# Patient Record
Sex: Female | Born: 1960 | Race: Black or African American | Hispanic: No | Smoking: Former smoker
Health system: Southern US, Community
[De-identification: ages and names within clinical notes are randomized; demographics above are authoritative.]

## PROBLEM LIST (undated history)

## (undated) DIAGNOSIS — I2699 Other pulmonary embolism without acute cor pulmonale: Secondary | ICD-10-CM

## (undated) DIAGNOSIS — T4145XA Adverse effect of unspecified anesthetic, initial encounter: Secondary | ICD-10-CM

## (undated) DIAGNOSIS — Z8719 Personal history of other diseases of the digestive system: Secondary | ICD-10-CM

## (undated) DIAGNOSIS — C3490 Malignant neoplasm of unspecified part of unspecified bronchus or lung: Secondary | ICD-10-CM

## (undated) DIAGNOSIS — I2510 Atherosclerotic heart disease of native coronary artery without angina pectoris: Secondary | ICD-10-CM

## (undated) DIAGNOSIS — J449 Chronic obstructive pulmonary disease, unspecified: Secondary | ICD-10-CM

## (undated) DIAGNOSIS — C801 Malignant (primary) neoplasm, unspecified: Secondary | ICD-10-CM

## (undated) DIAGNOSIS — I739 Peripheral vascular disease, unspecified: Secondary | ICD-10-CM

## (undated) DIAGNOSIS — C3491 Malignant neoplasm of unspecified part of right bronchus or lung: Secondary | ICD-10-CM

## (undated) DIAGNOSIS — I639 Cerebral infarction, unspecified: Secondary | ICD-10-CM

## (undated) DIAGNOSIS — F419 Anxiety disorder, unspecified: Secondary | ICD-10-CM

## (undated) DIAGNOSIS — I10 Essential (primary) hypertension: Secondary | ICD-10-CM

## (undated) DIAGNOSIS — H8110 Benign paroxysmal vertigo, unspecified ear: Secondary | ICD-10-CM

## (undated) HISTORY — DX: Malignant neoplasm of unspecified part of unspecified bronchus or lung: C34.90

## (undated) HISTORY — PX: BACK SURGERY: SHX140

## (undated) HISTORY — DX: Malignant neoplasm of unspecified part of right bronchus or lung: C34.91

## (undated) HISTORY — PX: UTERINE FIBROID SURGERY: SHX826

## (undated) HISTORY — PX: CERVICAL SPINE SURGERY: SHX589

---

## 1987-07-01 HISTORY — PX: CYST EXCISION: SHX5701

## 2003-07-01 HISTORY — PX: UTERINE FIBROID EMBOLIZATION: SHX825

## 2009-03-04 ENCOUNTER — Emergency Department (HOSPITAL_COMMUNITY): Admission: EM | Admit: 2009-03-04 | Discharge: 2009-03-04 | Payer: Self-pay | Admitting: Emergency Medicine

## 2010-10-04 LAB — BASIC METABOLIC PANEL
BUN: 12 mg/dL (ref 6–23)
GFR calc Af Amer: >60 mL/min (ref >60)
GFR calc non Af Amer: >60 mL/min (ref >60)
Potassium: 4 mEq/L (ref 3.5–5.1)
Sodium: 138 mEq/L (ref 135–145)

## 2010-10-04 LAB — CBC
HCT: 42.4 % (ref 36.0–46.0)
Hemoglobin: 14.2 g/dL (ref 12.0–15.0)
MCV: 90.5 fL (ref 78.0–100.0)
Platelets: 286 10*3/uL (ref 150–400)
RBC: 4.69 MIL/uL (ref 3.87–5.11)
WBC: 9.9 10*3/uL (ref 4.0–10.5)

## 2010-10-04 LAB — DIFFERENTIAL
Eosinophils Absolute: 0.1 10*3/uL (ref 0.0–0.7)
Eosinophils Relative: 1 % (ref 0–5)
Lymphocytes Relative: 37 % (ref 12–46)
Lymphs Abs: 3.6 10*3/uL (ref 0.7–4.0)
Monocytes Absolute: 0.7 10*3/uL (ref 0.1–1.0)
Monocytes Relative: 7 % (ref 3–12)

## 2010-10-04 LAB — BRAIN NATRIURETIC PEPTIDE: Pro B Natriuretic peptide (BNP): <30 pg/mL (ref 0.0–100.0)

## 2011-12-29 HISTORY — PX: CORONARY ANGIOPLASTY: SHX604

## 2012-06-30 DIAGNOSIS — I639 Cerebral infarction, unspecified: Secondary | ICD-10-CM

## 2012-06-30 HISTORY — DX: Cerebral infarction, unspecified: I63.9

## 2013-01-28 DIAGNOSIS — C801 Malignant (primary) neoplasm, unspecified: Secondary | ICD-10-CM

## 2013-01-28 HISTORY — DX: Malignant (primary) neoplasm, unspecified: C80.1

## 2013-02-10 DIAGNOSIS — T8859XA Other complications of anesthesia, initial encounter: Secondary | ICD-10-CM

## 2013-02-10 HISTORY — DX: Other complications of anesthesia, initial encounter: T88.59XA

## 2013-02-10 HISTORY — PX: TUMOR REMOVAL: SHX12

## 2013-04-08 NOTE — H&P (Signed)
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 Discussed  Multiple stones in the left submandibular duct. I believe these are amenable to intraoral excision. She is very reluctant to have her gland removed. I have explained to her that if the stones do come back, then we we may want to discuss removal of the gland. If they do not come back, and no further treatment would be necessary in the future. We will check with her cardiologist about stopping her Plavix prior to the surgery.   An incidental finding on the CT is right maxillary sinus disease, with calcifications, consistent with fungal sinusitis. She may require surgery for debridement of that but that can wait until we resolve this other more urgent issue. Reason For Visit  Holly Knight is here today at the kind request of none for consultation and opinion for salivary stone and nasal polyp. HPI  History of recurring left sialadenitis secondary to sialolithiasis which has been documented on CT imaging earlier in the year. In the interim, she was diagnosed with a small lung cancer and had surgery to remove that. She is here for second opinion on what to do about the submandibular gland. She saw an ENT doctor in [place]. Allergies  Penicillins Sulfa Drugs. Current Meds  Baclofen TABS;; RPT Diazepam TABS;; RPT Folic Acid TABS;; RPT HydrALAZINE HCl TABS;; RPT Aspirin TABS;; RPT Lotrel CAPS (Amlodipine Besy-Benazepril HCl);; RPT Lipitor TABS (Atorvastatin Calcium);; RPT Iron TABS;; RPT Plavix TABS (Clopidogrel Bisulfate);; RPT Potassium TABS;; RPT TraMADol HCl TABS;; RPT. Active Problems  Allergic rhinitis   (477.9) (J30.9) Blood clotting disorder   (286.9) (D68.9) Emphysema of lung   (492.8) (J43.9) Heartburn   (787.1) (R12) Hypertension   (401.9) (I10) Migraine headache   (346.90) (G43.909). PMH  BPPV (benign paroxysmal positional vertigo) (386.11) (H81.10) History  of malignant neoplasm of bronchus and lung (V10.11) (Z85.118) History of stroke (V12.54) (Z86.73) History of vascular surgery (V45.89) (Z98.89). PSH  Amputation Of Leg Above Knee Cath Stent Placement Oral Surgery Tooth Extraction. Family Hx  Family history of cardiac disorder: Grandmother (V17.49) (Z82.49) Family history of hypertension: Mother (V2.49) (Z64.49) Family history of lung cancer: Mother (V16.1) (Z80.1) Family history of seizures: Sister (V19.8) (Z84.89). Personal Hx  Alcohol use; social Former smoker 14Aug2014 [phone]) [phone]) No caffeine use. ROS  Systemic: Not feeling tired (fatigue).  No fever  and no night sweats.  Recent weight loss. Head: Headache. Eyes: Eye symptoms. Otolaryngeal: No hearing loss.  Earache.  No tinnitus  and no purulent nasal discharge.  No nasal passage blockage (stuffiness).  Snoring  and sneezing.  No hoarseness  and no sore throat. Cardiovascular: No chest pain or discomfort  and no palpitations. Pulmonary: No dyspnea, no cough, and no wheezing. Gastrointestinal: No dysphagia  and no heartburn.  No nausea, no abdominal pain, and no melena.  No diarrhea. Genitourinary: No dysuria. Endocrine: No muscle weakness. Musculoskeletal: No calf muscle cramps, no arthralgias, and no soft tissue swelling. Neurological: Dizziness.  No fainting.  Tingling.  No numbness. Psychological: Anxiety.  No depression. Skin: No rash. 12 system ROS was obtained and reviewed on the Health Maintenance form dated today.  Positive responses are shown above.  If the symptom is not checked, the patient has denied it. Vital Signs   Recorded by Skolimowski,Sharon on 28 Mar 2013 03:28 PM BP:110/64,  Height: 67 in, Weight: 137 lb, BMI: 21.5 kg/m2,  BMI Calculated: 21.46 ,  BSA Calculated: 1.72. Physical Exam  APPEARANCE: Well developed,  well nourished, in no acute distress.  Normal affect, in a pleasant mood.  Oriented to time, place and person. COMMUNICATION: Normal  voice   HEAD & FACE:  No scars, lesions or masses of head and face.  Sinuses nontender to palpation.  Salivary glands without mass or tenderness.  Facial strength symmetric.  No facial lesion, scars, or mass. EYES: EOMI with normal primary gaze alignment. Visual acuity grossly intact.  PERRLA EXTERNAL EAR & NOSE: No scars, lesions or masses  EAC & TYMPANIC MEMBRANE:  EAC shows no obstructing lesions or debris and tympanic membranes are normal bilaterally with good movement to insufflation. GROSS HEARING: Normal   TMJ:  Nontender  INTRANASAL EXAM: No polyps or purulence.  NASOPHARYNX: Normal, without lesions. LIPS, TEETH & GUMS: No lip lesions, normal dentition and normal gums. ORAL CAVITY/OROPHARYNX:  Oral mucosa moist without lesion or asymmetry of the palate, tongue, tonsil or posterior pharynx. There are 2 hard palpable objects deep to the floor of mouth mucosa. Both on the left side. Clear saliva is easily expressed from the right submandibular duct but not the left. NECK:  Supple without adenopathy or mass, except for the left submandibular gland which is firm and slightly enlarged, minimally tender. THYROID:  Normal with no masses palpable.  NEUROLOGIC:  No gross CN deficits. No nystagmus noted.   LYMPHATIC:  No enlarged nodes palpable. Results  CT reveals 3 radiopaque foreign objects in the left submandibular duct. There is no evidence of calculi within the gland or the hilum. Signature  Electronically signed by : Serena Colonel  M.D.; 03/28/2013 4:58 PM EST.

## 2013-04-08 NOTE — Progress Notes (Signed)
Unable to reach patient.  I reviewed Dr Lucky Rathke office notes from today.  I left a message on voice mail:  arrival time 8:30, main entrance , nothing to eat or drink after midnight, only medications to take day of surgery would be valium and tramadol if needed, take medications with a sip of water only, no lotions, powders, cologne, jewelry, piercings, do not be valuables and phone number for [facility].

## 2013-04-10 MED ORDER — CEFAZOLIN SODIUM-DEXTROSE 2-3 GM-% IV SOLR: 2.0000 g | INTRAVENOUS | Status: AC

## 2013-04-12 ENCOUNTER — Encounter (HOSPITAL_COMMUNITY): Payer: Self-pay | Admitting: *Deleted

## 2013-04-12 NOTE — Progress Notes (Addendum)
Pt has a history of BPPV, CAD with stent in 2012, COPD, HTN . Pt stated that last dose of Plavix was 04/06/13. Pt stated that she had a 'lung cancer surgery in Aug 2014, at [facility]."  Stated that she had cardiac clearance at [facility].  Pt reported that she had a stroke with lung surgery and residual effect is left hand weakness.  I reported history to Dr Noreene Larsson , who requested that records from hospital an d cardiologist be obtained and EKG ,CBC and BMET be done in [facility].  I asked patient to bring a current list of medications with dose and frequency.

## 2013-04-13 ENCOUNTER — Ambulatory Visit (HOSPITAL_COMMUNITY): Payer: Federal, State, Local not specified - PPO

## 2013-04-13 ENCOUNTER — Encounter (HOSPITAL_COMMUNITY)
Admission: RE | Disposition: A | Discharge status: Home / Self Care | Payer: Self-pay | Source: Ambulatory Visit | Attending: Otolaryngology

## 2013-04-13 ENCOUNTER — Encounter (HOSPITAL_COMMUNITY): Payer: Self-pay | Admitting: *Deleted

## 2013-04-13 ENCOUNTER — Ambulatory Visit (HOSPITAL_COMMUNITY)
Admission: RE | Admit: 2013-04-13 | Discharge: 2013-04-13 | Disposition: A | Discharge status: Home / Self Care | Payer: Federal, State, Local not specified - PPO | Source: Ambulatory Visit | Attending: Otolaryngology | Admitting: Otolaryngology

## 2013-04-13 ENCOUNTER — Encounter (HOSPITAL_COMMUNITY): Payer: Federal, State, Local not specified - PPO | Admitting: Anesthesiology

## 2013-04-13 ENCOUNTER — Ambulatory Visit (HOSPITAL_COMMUNITY): Payer: Federal, State, Local not specified - PPO | Admitting: Anesthesiology

## 2013-04-13 DIAGNOSIS — Z7902 Long term (current) use of antithrombotics/antiplatelets: Secondary | ICD-10-CM | POA: Insufficient documentation

## 2013-04-13 DIAGNOSIS — J449 Chronic obstructive pulmonary disease, unspecified: Secondary | ICD-10-CM | POA: Insufficient documentation

## 2013-04-13 DIAGNOSIS — H8110 Benign paroxysmal vertigo, unspecified ear: Secondary | ICD-10-CM | POA: Insufficient documentation

## 2013-04-13 DIAGNOSIS — Z8673 Personal history of transient ischemic attack (TIA), and cerebral infarction without residual deficits: Secondary | ICD-10-CM | POA: Insufficient documentation

## 2013-04-13 DIAGNOSIS — K115 Sialolithiasis: Secondary | ICD-10-CM | POA: Insufficient documentation

## 2013-04-13 DIAGNOSIS — K449 Diaphragmatic hernia without obstruction or gangrene: Secondary | ICD-10-CM | POA: Insufficient documentation

## 2013-04-13 DIAGNOSIS — Z85118 Personal history of other malignant neoplasm of bronchus and lung: Secondary | ICD-10-CM | POA: Insufficient documentation

## 2013-04-13 DIAGNOSIS — J4489 Other specified chronic obstructive pulmonary disease: Secondary | ICD-10-CM | POA: Insufficient documentation

## 2013-04-13 DIAGNOSIS — I10 Essential (primary) hypertension: Secondary | ICD-10-CM | POA: Insufficient documentation

## 2013-04-13 DIAGNOSIS — Z01812 Encounter for preprocedural laboratory examination: Secondary | ICD-10-CM | POA: Insufficient documentation

## 2013-04-13 DIAGNOSIS — F411 Generalized anxiety disorder: Secondary | ICD-10-CM | POA: Insufficient documentation

## 2013-04-13 DIAGNOSIS — J320 Chronic maxillary sinusitis: Secondary | ICD-10-CM | POA: Insufficient documentation

## 2013-04-13 DIAGNOSIS — Z01810 Encounter for preprocedural cardiovascular examination: Secondary | ICD-10-CM | POA: Insufficient documentation

## 2013-04-13 DIAGNOSIS — D689 Coagulation defect, unspecified: Secondary | ICD-10-CM | POA: Insufficient documentation

## 2013-04-13 DIAGNOSIS — I739 Peripheral vascular disease, unspecified: Secondary | ICD-10-CM | POA: Insufficient documentation

## 2013-04-13 DIAGNOSIS — J309 Allergic rhinitis, unspecified: Secondary | ICD-10-CM | POA: Insufficient documentation

## 2013-04-13 DIAGNOSIS — J438 Other emphysema: Secondary | ICD-10-CM | POA: Insufficient documentation

## 2013-04-13 DIAGNOSIS — Z01818 Encounter for other preprocedural examination: Secondary | ICD-10-CM | POA: Insufficient documentation

## 2013-04-13 DIAGNOSIS — I2510 Atherosclerotic heart disease of native coronary artery without angina pectoris: Secondary | ICD-10-CM | POA: Insufficient documentation

## 2013-04-13 DIAGNOSIS — G43909 Migraine, unspecified, not intractable, without status migrainosus: Secondary | ICD-10-CM | POA: Insufficient documentation

## 2013-04-13 HISTORY — DX: Anxiety disorder, unspecified: F41.9

## 2013-04-13 HISTORY — DX: Essential (primary) hypertension: I10

## 2013-04-13 HISTORY — DX: Atherosclerotic heart disease of native coronary artery without angina pectoris: I25.10

## 2013-04-13 HISTORY — DX: Benign paroxysmal vertigo, unspecified ear: H81.10

## 2013-04-13 HISTORY — DX: Personal history of other diseases of the digestive system: Z87.19

## 2013-04-13 HISTORY — DX: Malignant (primary) neoplasm, unspecified: C80.1

## 2013-04-13 HISTORY — PX: SALIVARY STONE REMOVAL: SHX5213

## 2013-04-13 HISTORY — DX: Chronic obstructive pulmonary disease, unspecified: J44.9

## 2013-04-13 HISTORY — DX: Cerebral infarction, unspecified: I63.9

## 2013-04-13 HISTORY — DX: Adverse effect of unspecified anesthetic, initial encounter: T41.45XA

## 2013-04-13 HISTORY — DX: Peripheral vascular disease, unspecified: I73.9

## 2013-04-13 HISTORY — DX: Other pulmonary embolism without acute cor pulmonale: I26.99

## 2013-04-13 LAB — CBC
MCV: 83.7 fL (ref 78.0–100.0)
Platelets: 291 10*3/uL (ref 150–400)
RBC: 3.99 MIL/uL (ref 3.87–5.11)
WBC: 5.3 10*3/uL (ref 4.0–10.5)

## 2013-04-13 LAB — BASIC METABOLIC PANEL
CO2: 20 mEq/L (ref 19–32)
Calcium: 10.8 mg/dL — ABNORMAL HIGH (ref 8.4–10.5)
Chloride: 102 mEq/L (ref 96–112)
Sodium: 137 mEq/L (ref 135–145)

## 2013-04-13 SURGERY — REMOVAL, CALCULUS, SALIVARY DUCT
Anesthesia: Monitor Anesthesia Care | Wound class: Clean Contaminated

## 2013-04-13 MED ORDER — HYDROMORPHONE HCL PF 1 MG/ML IJ SOLN: 0.2500 mg | INTRAMUSCULAR | Status: DC | PRN

## 2013-04-13 MED ORDER — FENTANYL CITRATE 0.05 MG/ML IJ SOLN
INTRAMUSCULAR | Status: DC | PRN
  Administered 2013-04-13: 100 ug via INTRAVENOUS

## 2013-04-13 MED ORDER — MIDAZOLAM HCL 2 MG/2ML IJ SOLN: 1.0000 mg | INTRAMUSCULAR | Status: DC | PRN

## 2013-04-13 MED ORDER — OXYCODONE HCL 5 MG PO TABS: 5.0000 mg | ORAL_TABLET | Freq: Once | ORAL | Status: DC | PRN

## 2013-04-13 MED ORDER — LACTATED RINGERS IV SOLN
INTRAVENOUS | Status: DC | PRN
  Administered 2013-04-13: 12:00:00 via INTRAVENOUS

## 2013-04-13 MED ORDER — PROMETHAZINE HCL 25 MG RE SUPP: 25.0000 mg | Freq: Four times a day (QID) | RECTAL | Status: DC | PRN

## 2013-04-13 MED ORDER — OXYCODONE HCL 5 MG/5ML PO SOLN: 5.0000 mg | Freq: Once | ORAL | Status: DC | PRN

## 2013-04-13 MED ORDER — MIDAZOLAM HCL 5 MG/5ML IJ SOLN
INTRAMUSCULAR | Status: DC | PRN
  Administered 2013-04-13 (×2): 2 mg via INTRAVENOUS

## 2013-04-13 MED ORDER — LACTATED RINGERS IV SOLN
INTRAVENOUS | Status: DC
  Administered 2013-04-13: 11:00:00 via INTRAVENOUS

## 2013-04-13 MED ORDER — LIDOCAINE-EPINEPHRINE 1 %-1:100000 IJ SOLN
INTRAMUSCULAR | Status: AC
  Filled 2013-04-13: qty 1

## 2013-04-13 MED ORDER — PROMETHAZINE HCL 25 MG/ML IJ SOLN: 6.2500 mg | INTRAMUSCULAR | Status: DC | PRN

## 2013-04-13 MED ORDER — CLINDAMYCIN HCL 300 MG PO CAPS: 300.0000 mg | ORAL_CAPSULE | Freq: Two times a day (BID) | ORAL | Status: DC

## 2013-04-13 MED ORDER — HYDROCODONE-ACETAMINOPHEN 7.5-325 MG PO TABS: 1.0000 | ORAL_TABLET | Freq: Four times a day (QID) | ORAL | Status: DC | PRN

## 2013-04-13 MED ORDER — LIDOCAINE-EPINEPHRINE 1 %-1:100000 IJ SOLN
INTRAMUSCULAR | Status: DC | PRN
  Administered 2013-04-13: 20 mL

## 2013-04-13 MED ORDER — FENTANYL CITRATE 0.05 MG/ML IJ SOLN: 50.0000 ug | Freq: Once | INTRAMUSCULAR | Status: DC

## 2013-04-13 MED ORDER — 0.9 % SODIUM CHLORIDE (POUR BTL) OPTIME
TOPICAL | Status: DC | PRN
  Administered 2013-04-13: 1000 mL

## 2013-04-13 SURGICAL SUPPLY — 21 items
CANISTER SUCTION 2500CC (MISCELLANEOUS) ×2 IMPLANT
CLOTH BEACON ORANGE TIMEOUT ST (SAFETY) ×2 IMPLANT
CONT SPEC 4OZ CLIKSEAL STRL BL (MISCELLANEOUS) ×2 IMPLANT
COVER SURGICAL LIGHT HANDLE (MISCELLANEOUS) ×2 IMPLANT
ELECT COATED BLADE 2.86 ST (ELECTRODE) ×2 IMPLANT
ELECT REM PT RETURN 9FT ADLT (ELECTROSURGICAL) ×2
ELECTRODE REM PT RTRN 9FT ADLT (ELECTROSURGICAL) ×1 IMPLANT
GAUZE SPONGE 4X4 16PLY XRAY LF (GAUZE/BANDAGES/DRESSINGS) ×2 IMPLANT
GLOVE BIO SURGEON STRL SZ 6.5 (GLOVE) ×2 IMPLANT
GLOVE BIOGEL PI IND STRL 6.5 (GLOVE) ×1 IMPLANT
GLOVE BIOGEL PI INDICATOR 6.5 (GLOVE) ×1
GLOVE ECLIPSE 7.5 STRL STRAW (GLOVE) ×2 IMPLANT
GOWN STRL NON-REIN LRG LVL3 (GOWN DISPOSABLE) ×4 IMPLANT
KIT BASIN OR (CUSTOM PROCEDURE TRAY) ×2 IMPLANT
KIT ROOM TURNOVER OR (KITS) ×2 IMPLANT
NEEDLE 27GAX1X1/2 (NEEDLE) ×2 IMPLANT
NS IRRIG 1000ML POUR BTL (IV SOLUTION) ×2 IMPLANT
PAD ARMBOARD 7.5X6 YLW CONV (MISCELLANEOUS) ×4 IMPLANT
PENCIL FOOT CONTROL (ELECTRODE) ×2 IMPLANT
TOWEL OR 17X26 10 PK STRL BLUE (TOWEL DISPOSABLE) ×2 IMPLANT
TRAY ENT MC OR (CUSTOM PROCEDURE TRAY) ×2 IMPLANT

## 2013-04-13 NOTE — Anesthesia Preprocedure Evaluation (Addendum)
Anesthesia Evaluation  Patient identified by MRN, date of birth, ID band Patient awake    Reviewed: Allergy & Precautions, H&P , NPO status , Patient's Chart, lab work & pertinent test results  Airway Mallampati: II TM Distance: >3 FB Neck ROM: Full    Dental   Pulmonary COPD + rhonchi   + decreased breath sounds      Cardiovascular hypertension, + CAD and + Peripheral Vascular Disease Rhythm:Regular Rate:Normal     Neuro/Psych Anxiety CVA    GI/Hepatic hiatal hernia,   Endo/Other    Renal/GU      Musculoskeletal   Abdominal   Peds  Hematology   Anesthesia Other Findings   Reproductive/Obstetrics                          Anesthesia Physical Anesthesia Plan  ASA: III  Anesthesia Plan: MAC   Post-op Pain Management:    Induction: Intravenous  Airway Management Planned: Nasal Cannula  Additional Equipment:   Intra-op Plan:   Post-operative Plan: Extubation in OR  Informed Consent: I have reviewed the patients History and Physical, chart, labs and discussed the procedure including the risks, benefits and alternatives for the proposed anesthesia with the patient or authorized representative who has indicated his/her understanding and acceptance.     Plan Discussed with: CRNA and Surgeon  Anesthesia Plan Comments: (Pt desires MAC-surgeon in agreement)       Anesthesia Quick Evaluation

## 2013-04-13 NOTE — Transfer of Care (Signed)
Immediate Anesthesia Transfer of Care Note  Patient: Holly Knight  Procedure(s) Performed: Procedure(s): INTRAORAL EXCISION SUBMANDIBULAR STONES  (N/A)  Patient Location: PACU  Anesthesia Type:MAC  Level of Consciousness: awake, alert  and oriented  Airway & Oxygen Therapy: Patient Spontanous Breathing  Post-op Assessment: Report given to PACU RN and Post -op Vital signs reviewed and stable  Post vital signs: Reviewed and stable  Complications: No apparent anesthesia complications

## 2013-04-13 NOTE — Anesthesia Postprocedure Evaluation (Signed)
  Anesthesia Post-op Note  Patient: Holly Knight  Procedure(s) Performed: Procedure(s): INTRAORAL EXCISION SUBMANDIBULAR STONES  (N/A)  Patient Location: PACU  Anesthesia Type:MAC  Level of Consciousness: awake and alert   Airway and Oxygen Therapy: Patient Spontanous Breathing  Post-op Pain: mild  Post-op Assessment: Post-op Vital signs reviewed, Patient's Cardiovascular Status Stable, Respiratory Function Stable, Patent Airway, No signs of Nausea or vomiting and Pain level controlled  Post-op Vital Signs: Reviewed and stable  Complications: No apparent anesthesia complications

## 2013-04-13 NOTE — Op Note (Signed)
OPERATIVE REPORT  DATE OF SURGERY: 04/13/2013  PATIENT:  Holly Knight,  52 y.o. female  PRE-OPERATIVE DIAGNOSIS:  SUBMANDIBULAR STONES   POST-OPERATIVE DIAGNOSIS:  SUBMANDIBULAR STONES   PROCEDURE:  Procedure(s): INTRAORAL EXCISION SUBMANDIBULAR STONES   SURGEON:  Susy Frizzle, MD  ASSISTANTS: none  ANESTHESIA:   Local with intravenous sedation and  monitored anesthesia care  EBL:  5 ml  DRAINS: None   LOCAL MEDICATIONS USED:  1% Xylocaine with epinephrine  SPECIMEN:  3 submandibular calculi  COUNTS:  Correct  PROCEDURE DETAILS: The patient was taken to the operating room and placed on the operating table in the supine position. Following induction of intravenous sedation the patient was draped in a standard fashion. 1% Xylocaine with epinephrine was infiltrated into the floor of mouth on the left side. The submandibular duct was identified and cannulated with a small lacrimal probe. It was then serially dilated up to 1.8 mm. 15 scalpel was used to incise the overlying mucosa opening up the duct widely. Purulent material was obtained from the duct and were suctioned away. 2 small stones were identified. Additional dissection further back reveals the major larger stone. Following that there was no further purulent secretions and no further stone palpable. The patient tolerated this well and was transferred to PACU in stable condition.    PATIENT DISPOSITION:  To PACU, stable

## 2013-04-13 NOTE — Interval H&P Note (Signed)
History and Physical Interval Note:  04/13/2013 12:19 PM  Holly Knight  has presented today for surgery, with the diagnosis of SUBMANDIBULAR STONES   The various methods of treatment have been discussed with the patient and family. After consideration of risks, benefits and other options for treatment, the patient has consented to  Procedure(s): INTRAORAL EXCISION SUBMANDIBULAR STONES  (N/A) as a surgical intervention .  The patient's history has been reviewed, patient examined, no change in status, stable for surgery.  I have reviewed the patient's chart and labs.  Questions were answered to the patient's satisfaction.     Sakiyah Shur

## 2013-04-13 NOTE — Preoperative (Signed)
Beta Blockers   Reason not to administer Beta Blockers:Not Applicable 

## 2013-04-15 ENCOUNTER — Encounter (HOSPITAL_COMMUNITY): Payer: Self-pay | Admitting: Otolaryngology

## 2016-08-05 ENCOUNTER — Telehealth (HOSPITAL_COMMUNITY): Payer: Self-pay | Admitting: Oncology

## 2016-08-05 NOTE — Patient Instructions (Addendum)
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  We are going to treat you with cisplatin and etoposide.  Cisplatin is day 1 every 21 days.  Etoposide is day 1, day 2, and day 3 every 21 days.  You will get 4 cycles of this treatment.  1 cycle is 21 days.  With the cisplatin treatment you will get 2 hours of hydration prior to receiving that drug and 2 hours of hydration fluid after.  This will help protect your kidneys.  This treatment is with curative intent.   You will see the doctor regularly throughout treatment.  We monitor your lab work prior to every treatment.  The doctor monitors your response to treatment by the way you are feeling, your blood work, and scans periodically.  You will receive the following premedications prior to getting chemotherapy: Premeds: Aloxi - high powered nausea/vomiting prevention medication used for chemotherapy patients. Emend - high powered nausea/vomiting prevention medication used for chemotherapy patients. Dexamethasone - steroid - given to reduce the risk of you having an allergic type reaction to the chemotherapy. Dex can cause you to feel energized, nervous/anxious/jittery, make you have trouble sleeping, and/or make you feel hot/flushed in the face/neck and/or look pink/red in the face/neck. These side effects will pass as the Dex wears off. (takes 20 minutes to infuse)   POTENTIAL SIDE EFFECTS OF TREATMENT: Cisplatin (Generic Name) Other Names: Platinol, platinum  About This Drug Cisplatin is a drug used to treat cancer. This drug is given in the vein (IV). Takes 1 hour to infuse.  Possible Side Effects (More Common) . This drug may affect how your kidneys work. Your kidney function will be checked as needed. . Electrolyte changes. Your blood will be checked for electrolyte changes as needed. . High-frequency hearing loss may occur. You will get IV fluids before and  during the Cisplatin infusion to help prevent this. You may also get ringing in the ears. . Bone marrow depression. This is a decrease in the number of white blood cells, red blood cells, and platelets. This may raise your risk of infection, make you tired and weak (fatigue), and raise your risk of bleeding. . Nausea and throwing up (vomiting). These symptoms may happen within a few hours after your treatment and may last for a few days to a week. Medicines are available to stop or lessen these side effects.  Possible Side Effects (Less Common) . Effects on the nerves are called peripheral neuropathy. You may feel numbness or pain in your hands and feet. It may be hard for you to button your clothes, open jars, or walk as usual. The effect on the nerves may get worse with more doses of the drug. These effects get better in some people after the drug is stopped, but it does not get better in all people. Marland Kitchen Blurred vision or other changes in eyesight. . Soreness of the mouth and throat. You may have red areas, white patches, or sores that hurt. . Hair loss. You may notice your hair getting thin. Some patients lose their hair. Your hair often grows back when treatment is done.  Allergic Reactions Allergic reactions to this drug are rare, but may happen in some patients. Signs of allergic reactions to this drug may be a rash, fever, chills, feeling dizzy, trouble breathing, and/or feeling that your heart is beating in a fast or not normal way.  Treating Side Effects .  Drink 6-8 cups of fluids each day unless your doctor has told you to limit your fluid intake due to some other health problem. A cup is 8 ounces of fluid. If you throw up or have loose bowel movements you should drink more fluids so that you do not become dehydrated (lack water in the body due to losing too much fluid). . If you have numbness and tingling in your hands and feet, be careful when cooking, walking, and handling sharp objects and  hot liquids. . Mouth care is very important. Your mouth care should consist of routine, gentle cleaning of your teeth or dentures and rinsing your mouth with a mixture of 1/2 teaspoon of salt in 8 ounces of water or  teaspoon of baking soda in 8 ounces of water. This should be done at least after each meal and at bedtime. . If you have mouth sores, avoid mouthwash that has alcohol. Also avoid alcohol and smoking because they can bother your mouth and throat. . Talk with your nurse about getting a wig before you lose your hair. Also, call the [place] at 800-ACS-2345 to find out information about the "Look Good, Feel Better" program close to where you live. It is a free program where women getting chemotherapy can learn about wigs, turbans and scarves as well as makeup techniques and skin and nail care.  Food and Drug Interactions There are no known interactions of Cisplatin with food. This drug may interact with other medicines. Tell your doctor and pharmacist about all the medicines and dietary supplements (vitamins, minerals, herbs and others) that you are taking at this time. The safety and use of dietary supplements and alternative diets are often not known. Using these might affect your cancer or interfere with your treatment. Until more is known, you should not use dietary supplements or alternative diets without your cancer doctor's help.  When to Call the Doctor Call your doctor or nurseright awayif you have any of these symptoms: . Rash or itching . Feeling dizzy or lightheaded . Wheezing or trouble breathing . Swelling of the face . Fever of 100.5 F (38 C) or above . Chills . Easy bleeding or bruising . Decreased urine . Weight gain of 5 pounds in one week (fluid retention) . Nausea that stops you from eating or drinking . Throwing up more than 3 times a day Call your doctor or nurse as soon as possible if you have these symptoms: . Numbness, tingling, decreased  feeling or weakness in fingers, toes, arms, or legs . Trouble walking or changes in the way you walk, feeling clumsy when buttoning clothes, opening jars, or other routine hand motions . Blurred vision or other changes in eyesight . Changes in hearing, ringing in the ears . Pain in your mouth or throat that makes it hard to eat or drink . Fatigue that interferes with your daily activities  Sexual Problems and Reproductive Concerns . Infertility warning: Sexual problems and reproduction concerns may occur. In both men and women, this drug may affect your ability to have children. This cannot be determined before your treatment. Speak with your doctor or nurse if you plan to have children. Ask for information on sperm or egg banking. . In men, this drug may interfere with your ability to make sperm, but it should not change your ability to have sexual relations. . In women, menstrual bleeding may become irregular or stop while you are receiving this drug. Do not assume that you  cannot become pregnant if you do not have a menstrual period. . Women may experience signs of menopause like vaginal dryness, itching, and pain during sexual relations . Genetic counseling is available for you to talk about the effects of this drug therapy on future pregnancies. Also, a genetic counselor can look at the possible risk of problems in the unborn baby due to this medicine if an exposure happens during pregnancy. . Pregnancy warning: The drug may have harmful effects on the unborn child, so effective methods of birth control should be used during your cancer treatment. . Breast feeding warning: Women should not breast feed during treatment because this drug could enter the breast milk and badly harm a breast feeding baby.   Etoposide (Generic Name) Other Names: VePesid, VP-16  About This Drug Etoposide is a drug used to treat cancer. This drug is given in the vein (IV) or by mouth.  Takes 1 hour to infuse.     Possible Side Effects (More Common) . Nausea and throwing up (vomiting). These symptoms may happen within one to six hours after getting this drug and may last up to 72 hours. Medicines are available to stop or lessen these side effects . Bone marrow depression. This is a decrease in the number of white blood cells, red blood cells, and platelets. This may raise your risk of infection, make you tired and weak (fatigue), and raise your risk of bleeding. . Decreased appetite (decreased hunger) . Hair loss. Most patients lost hair on their scalp and body. You may notice hair thinning five to seven days after getting this drug. Often hair loss is temporary; your hair should grow back when treatment is done. . Fatigue  Possible Side Effects (Less Common) . Loose bowel movements (diarrhea) that may last for several days . Soreness of the mouth and throat. You may have red areas, white patches, or sores that hurt. . Increased total bilirubin in your blood. This may mean that you have changes in your liver function. Your blood work will be checked by your doctor. . Effects on the nerves are called peripheral neuropathy. You may feel numbness, tingling, or pain in your hands and feet. It may be hard for you to button your clothes, open jars, or walk as usual. The effect on the nerves may get worse with more doses of the drug. These effects get better in some people after the drug is stopped but it does not get better in all people. . Rash . Blood pressure may be low while getting this drug in an IV.  Allergic Reactions Serious allergic reactions including anaphylaxis are rare. While you are getting this drug in your vein (IV), tell your nurse right away if you have any of these symptoms of an allergic reaction: . trouble catching your breath . feeling like your tongue or throat are swelling . feeling your heart beat quickly or in a not normal way (palpitations) . feeling dizzy or lightheaded .  flushing, itching, rash, and/or hives  Treating Side Effects . If you are getting this drug in an IV, tell your nurse knowright away if you are feeling lightheaded or dizzy. . Talk with your nurse about getting a wig before you lose your hair. Also, call the [facility] at 800-ACS-2345 to find out information about the "Look Good, Feel Better" program close to where you live. It is a free program where women getting chemotherapy can learn about wigs, turbans and scarves as well as makeup  techniques and skin and nail care. . Drink 6-8 cups of fluids each day unless your doctor has told you to limit your fluid intake due to some other health problem. A cup is 8 ounces of fluid. If you throw up or have loose bowel movements, you should drink more fluids so that you do not become dehydrated (lack water in the body from losing too much fluid). . Mouth care is very important. Your mouth care should consist of regular, gentle cleaning of your teeth or dentures and rinsing your mouth with a mixture of  teaspoon of salt in 8 ounces of water or  teaspoon of sodium bicarbonate (baking soda) in 8 ounces of water. This should be done at least after every meal and at bedtime. . If you have mouth sores, avoid mouthwash that contains alcohol. Also avoid alcohol and smoking because they can bother your mouth and throat. . If you get a rash do not put anything on it unless your doctor or nurse says you may. Keep the area around the rash clean and dry. Let your doctor know right away if you get a rash while on this medicine. . Ask your doctor or nurse about medicine that is available to help stop or lessen nausea or throwing up. . Be careful when cooking, walking, and handling sharp objects and hot liquids.  Food and Drug Interactions There are no known interactions of etoposide with food. This drug may interact with other medication. Tell your doctor and pharmacist about all the medication and dietary  supplements (vitamins, minerals, herbs and others) that you are taking at this time. The safety and effectiveness of dietary supplements and alternative diets are often unknown. Using these might affect your cancer or interfere with your treatment. Until more is known, you should not use dietary supplements or alternative diets without your cancer doctor's help.  Important Information . Take etoposide pills one hour before eating or two to three hours after eating. . Store this drug in the refrigerator. . Missed dose: If you miss a dose, take it as soon as you remember, unless it is close to the time of your next dose. If it is close to your next dose, just take the next dose at your normal time. Do not take 2 doses at once. Do not take extra doses.  When to Call the Doctor Call your doctor or nurse right away if you have any of these symptoms: . Trouble breathing or feeling short of breath . Fever of 100.5 F (38 C) or higher . Chills . Easy bleeding or bruising . Rash or itching . Feeling dizzy or lightheaded . Feeling that your heart is beating in a fast or not normal way (palpitations) . Loose bowel movements (diarrhea) more than 4 times a day or diarrhea with weakness or feeling lightheaded . Feeling confused . Nausea that stops you from eating or drinking . Throwing up more than 3 times a day Call your doctor or nurse as soon as possible if you have any of these symptoms: . Numbness, tingling, decreased feeling or weakness in fingers, toes, arms, or legs . Trouble walking or changes in the way you walk . Pain in your mouth or throat that makes it hard to eat or drink  Sexual Problems and Reproductive Concerns . Infertility warning: Sexual problems and reproduction concerns may happen. In both men and women, this drug may affect your ability to have children. This cannot be determined before your treatment. Talk with  your doctor or nurse if you plan to have children. Ask for information  on sperm or egg banking. . In men, this drug may interfere with your ability to make sperm, but it should not change your ability to have sexual relations. . In women, menstrual bleeding may become irregular or stop while you are getting this drug. Do not assume that you cannot become pregnant if you do not have a menstrual period. . Women may go through signs of menopause (change of life) like vaginal dryness or itching. Vaginal lubricants can be used to lessen vaginal dryness, itching, and pain during sexual relations. . Genetic counseling is available for you to talk about the effects of this drug therapy on future pregnancies. Also, a genetic counselor can look at the possible risk of problems in the unborn baby due to this medicine if an exposure happens during pregnancy. . Pregnancy warning: This drug may have harmful effects on the unborn child, so effective methods of birth control should be used during your cancer treatment. Ask your doctor or nurse about effective methods of birth control. . Breast feeding warning: It is not known if this drug passes into breast milk. For this reason, women should talk to their doctor about the risks and benefits of breast feeding during treatment with this drug because this drug may enter the breast milk and badly harm a breast feeding baby.    SELF CARE ACTIVITIES WHILE ON CHEMOTHERAPY: Hydration Increase your fluid intake 48 hours prior to treatment and drink at least 8 to 12 cups (64 ounces) of water/decaff beverages per day after treatment. You can still have your cup of coffee or soda but these beverages do not count as part of your 8 to 12 cups that you need to drink daily. No alcohol intake.  Medications Continue taking your normal prescription medication as prescribed.  If you start any new herbal or new supplements please let us know first to make sure it is safe.  Mouth Care Have teeth cleaned professionally before starting treatment. Keep  dentures and partial plates clean. Use soft toothbrush and do not use mouthwashes that contain alcohol. Biotene is a good mouthwash that is available at most pharmacies or may be ordered by calling [phone]. Use warm salt water gargles (1 teaspoon salt per 1 quart warm water) before and after meals and at bedtime. Or you may rinse with 2 tablespoons of three-percent hydrogen peroxide mixed in eight ounces of water. If you are still having problems with your mouth or sores in your mouth please call the clinic. If you need dental work, please let Dr. Whitney Muse know before you go for your appointment so that we can coordinate the best possible time for you in regards to your chemo regimen. You need to also let your dentist know that you are actively taking chemo. We may need to do labs prior to your dental appointment.   Skin Care Always use sunscreen that has not expired and with SPF (Sun Protection Factor) of 50 or higher. Wear hats to protect your head from the sun. Remember to use sunscreen on your hands, ears, face, & feet.  Use good moisturizing lotions such as udder cream, eucerin, or even Vaseline. Some chemotherapies can cause dry skin, color changes in your skin and nails.    . Avoid long, hot showers or baths. . Use gentle, fragrance-free soaps and laundry detergent. . Use moisturizers, preferably creams or ointments rather than lotions because the thicker consistency is  better at preventing skin dehydration. Apply the cream or ointment within 15 minutes of showering. Reapply moisturizer at night, and moisturize your hands every time after you wash them.  Hair Loss (if your doctor says your hair will fall out)  . If your doctor says that your hair is likely to fall out, decide before you begin chemo whether you want to wear a wig. You may want to shop before treatment to match your hair color. . Hats, turbans, and scarves can also camouflage hair loss, although some people prefer to leave  their heads uncovered. If you go bare-headed outdoors, be sure to use sunscreen on your scalp. . Cut your hair short. It eases the inconvenience of shedding lots of hair, but it also can reduce the emotional impact of watching your hair fall out. . Don't perm or color your hair during chemotherapy. Those chemical treatments are already damaging to hair and can enhance hair loss. Once your chemo treatments are done and your hair has grown back, it's OK to resume dyeing or perming hair. With chemotherapy, hair loss is almost always temporary. But when it grows back, it may be a different color or texture. In older adults who still had hair color before chemotherapy, the new growth may be completely gray.  Often, new hair is very fine and soft.  Infection Prevention Please wash your hands for at least 30 seconds using warm soapy water. Handwashing is the #1 way to prevent the spread of germs. Stay away from sick people or people who are getting over a cold. If you develop respiratory systems such as green/yellow mucus production or productive cough or persistent cough let us know and we will see if you need an antibiotic. It is a good idea to keep a pair of gloves on when going into grocery stores/[employer] to decrease your risk of coming into contact with germs on the carts, etc. Carry alcohol hand gel with you at all times and use it frequently if out in public. If your temperature reaches 100.5 or higher please call the clinic and let us know.  If it is after hours or on the weekend please go to the ER if your temperature is over 100.5.  Please have your own personal thermometer at home to use.    Sex and bodily fluids If you are going to have sex, a condom must be used to protect the person that isn't taking chemotherapy. Chemo can decrease your libido (sex drive). For a few days after chemotherapy, chemotherapy can be excreted through your bodily fluids.  When using the toilet please close the lid and flush  the toilet twice.  Do this for a few day after you have had chemotherapy.   Effects of chemotherapy on your sex life Some changes are simple and won't last long. They won't affect your sex life permanently. Sometimes you may feel: . too tired . not strong enough to be very active . sick or sore  . not in the mood . anxious or low Your anxiety might not seem related to sex. For example, you may be worried about the cancer and how your treatment is going. Or you may be worried about money, or about how you family are coping with your illness. These things can cause stress, which can affect your interest in sex. It's important to talk to your partner about how you feel. Remember - the changes to your sex life don't usually last long. There's usually no medical reason  to stop having sex during chemo. The drugs won't have any long term physical effects on your performance or enjoyment of sex. Cancer can't be passed on to your partner during sex  Contraception It's important to use reliable contraception during treatment. Avoid getting pregnant while you or your partner are having chemotherapy. This is because the drugs may harm the baby. Sometimes chemotherapy drugs can leave a man or woman infertile.  This means you would not be able to have children in the future. You might want to talk to someone about permanent infertility. It can be very difficult to learn that you may no longer be able to have children. Some people find counselling helpful. There might be ways to preserve your fertility, although this is easier for men than for women. You may want to speak to a fertility expert. You can talk about sperm banking or harvesting your eggs. You can also ask about other fertility options, such as donor eggs. If you have or have had breast cancer, your doctor might advise you not to take the contraceptive pill. This is because the hormones in it might affect the cancer.  It is not known for sure whether  or not chemotherapy drugs can be passed on through semen or secretions from the vagina. Because of this some doctors advise people to use a barrier method if you have sex during treatment. This applies to vaginal, anal or oral sex. Generally, doctors advise a barrier method only for the time you are actually having the treatment and for about a week after your treatment. Advice like this can be worrying, but this does not mean that you have to avoid being intimate with your partner. You can still have close contact with your partner and continue to enjoy sex.  Animals If you have cats or birds we just ask that you not change the litter or change the cage.  Please have someone else do this for you while you are on chemotherapy.   Food Safety During and After Cancer Treatment Food safety is important for people both during and after cancer treatment. Cancer and cancer treatments, such as chemotherapy, radiation therapy, and stem cell/bone marrow transplantation, often weaken the immune system. This makes it harder for your body to protect itself from foodborne illness, also called food poisoning. Foodborne illness is caused by eating food that contains harmful bacteria, parasites, or viruses.  Foods to avoid Some foods have a higher risk of becoming tainted with bacteria. These include: Marland Kitchen Unwashed fresh fruit and vegetables, especially leafy vegetables that can hide dirt and other contaminants . Raw sprouts, such as alfalfa sprouts . Raw or undercooked beef, especially ground beef, or other raw or undercooked meat and poultry . Fatty, fried, or spicy foods immediately before or after treatment.  These can sit heavy on your stomach and make you feel nauseous. . Raw or undercooked shellfish, such as oysters. . Sushi and sashimi, which often contain raw fish.  . Unpasteurized beverages, such as unpasteurized fruit juices, raw milk, raw yogurt, or cider . Undercooked eggs, such as soft boiled, over  easy, and poached; raw, unpasteurized eggs; or foods made with raw egg, such as homemade raw cookie dough and homemade mayonnaise Simple steps for food safety Shop smart. . Do not buy food stored or displayed in an unclean area. . Do not buy bruised or damaged fruits or vegetables. . Do not buy cans that have cracks, dents, or bulges. . Pick up foods that can spoil  at the end of your shopping trip and store them in a cooler on the way home. Prepare and clean up foods carefully. . Rinse all fresh fruits and vegetables under running water, and dry them with a clean towel or paper towel. . Clean the top of cans before opening them. . After preparing food, wash your hands for 20 seconds with hot water and soap. Pay special attention to areas between fingers and under nails. . Clean your utensils and dishes with hot water and soap. [place] Kitchen Disinfect your kitchen and cutting boards using 1 teaspoon of liquid, unscented bleach mixed into 1 quart of water.   Dispose of old food. . Eat canned and packaged food before its expiration date (the "use by" or "best before" date). . Consume refrigerated leftovers within 3 to 4 days. After that time, throw out the food. Even if the food does not smell or look spoiled, it still may be unsafe. Some bacteria, such as Listeria, can grow even on foods stored in the refrigerator if they are kept for too long. Take precautions when eating out. . At restaurants, avoid buffets and salad bars where food sits out for a long time and comes in contact with many people. Food can become contaminated when someone with a virus, often a norovirus, or another "bug" handles it. . Put any leftover food in a "to-go" container yourself, rather than having the server do it. And, refrigerate leftovers as soon as you get home. . Choose restaurants that are clean and that are willing to prepare your food as you order it cooked.   MEDICATIONS:                                                                                                                                                               Zofran/Ondansetron '8mg'$  tablet. Take 1 tablet every 8 hours as needed for nausea/vomiting. (#1 nausea med to take, this can constipate)  Compazine/Prochlorperazine '10mg'$  tablet. Take 1 tablet every 6 hours as needed for nausea/vomiting. (#2 nausea med to take, this can make you sleepy)   EMLA cream. Apply a quarter size amount to port site 1 hour prior to chemo. Do not rub in. Cover with plastic wrap.   Over-the-Counter Meds:  Miralax 1 capful in 8 oz of fluid daily. May increase to two times a day if needed. This is a stool softener. If this doesn't work proceed you can add:  Senokot S-start with 1 tablet two times a day and increase to 4 tablets two times a day if needed. (total of 8 tablets in a 24 hour period). This is a stimulant laxative.   Call us if this does not help your bowels move.   Imodium '2mg'$  capsule. Take 2 capsules after the 1st loose stool  and then 1 capsule every 2 hours until you go a total of 12 hours without having a loose stool. Call the [facility] if loose stools continue. If diarrhea occurs @ bedtime, take 2 capsules @ bedtime. Then take 2 capsules every 4 hours until morning. Call [facility].     Constipation Sheet *Miralax in 8 oz of fluid daily.  May increase to two times a day if needed.  This is a stool softener.  If this not enough to keep your bowel regular:  You can add:  *Senokot S, start with one tablet twice a day and can increase to 4 tablets twice a day if needed.  This is a stimulant laxative.   Sometimes when you take pain medication you need BOTH a medicine to keep your stool soft and a medicine to help your bowel push it out!  Please call if the above does not work for you.   Do not go more than 2 days without a bowel movement.  It is very important that you do not become constipated.  It will make you feel sick to your stomach  (nausea) and can cause abdominal pain and vomiting.     Diarrhea Sheet  If you are having loose stools/diarrhea, please purchase Imodium and begin taking as outlined:  At the first sign of poorly formed or loose stools you should begin taking Imodium(loperamide) 2 mg capsules.  Take two caplets ('4mg'$ ) followed by one caplet ('2mg'$ ) every 2 hours until you have had no diarrhea for 12 hours.  During the night take two caplets ('4mg'$ ) at bedtime and continue every 4 hours during the night until the morning.  Stop taking Imodium only after there is no sign of diarrhea for 12 hours.    Always call the [facility] if you are having loose stools/diarrhea that you can't get under control.  Loose stools/disrrhea leads to dehydration (loss of water) in your body.  We have other options of trying to get the loose stools/diarrhea to stopped but you must let us know!     Nausea Sheet  Zofran/Ondansetron '8mg'$  tablet. Take 1 tablet every 8 hours as needed for nausea/vomiting. (#1 nausea med to take, this can constipate)  Compazine/Prochlorperazine '10mg'$  tablet. Take 1 tablet every 6 hours as needed for nausea/vomiting. (#2 nausea med to take, this can make you sleepy)  You can take these medications together or separately.  We would first like for you to try the Ondansetron by itself and then take the Prochloperizine if needed. But you are allowed to take both medications at the same time if your nausea is that severe.  If you are having persistent nausea (nausea that does not stop) please take these medications on a staggered schedule so that the nausea medication stays in your body.  Please call the [facility] and let us know the amount of nausea that you are experiencing.  If you begin to vomit, you need to call the [facility] and if it is the weekend and you have vomited more than one time and cant get it to stop-go to the Emergency Room.  Persistent nausea/vomiting can lead to dehydration (loss of fluid in  your body) and will make you feel terrible.   Ice chips, sips of clear liquids, foods that are @ room temperature, crackers, and toast tend to be better tolerated.     SYMPTOMS TO REPORT AS SOON AS POSSIBLE AFTER TREATMENT:  FEVER GREATER THAN 100.5 F  CHILLS WITH OR  WITHOUT FEVER  NAUSEA AND VOMITING THAT IS NOT CONTROLLED WITH YOUR NAUSEA MEDICATION  UNUSUAL SHORTNESS OF BREATH  UNUSUAL BRUISING OR BLEEDING  TENDERNESS IN MOUTH AND THROAT WITH OR WITHOUT PRESENCE OF ULCERS  URINARY PROBLEMS  BOWEL PROBLEMS  UNUSUAL RASH    Wear comfortable clothing and clothing appropriate for easy access to any Portacath or PICC line. Let us know if there is anything that we can do to make your therapy better!    What to do if you need assistance after hours or on the weekends: CALL [phone].  HOLD on the line, do not hang up.  You will hear multiple messages but at the end you will be connected with a nurse triage line.  They will contact the doctor if necessary.  Most of the time they will be able to assist you.  Do not call the hospital operator.     I have been informed and understand all of the instructions given to me and have received a copy. I have been instructed to call the clinic [phone] or my family physician as soon as possible for continued medical care, if indicated. I do not have any more questions at this time but understand that I may call the [facility] or the Patient Navigator at [phone] during office hours should I have questions or need assistance in obtaining follow-up care.

## 2016-08-05 NOTE — Telephone Encounter (Signed)
PC TO [facility] [phone] TO SEE IF E3200 ETOPOSIDE REQ AUTH. PER NORMA S IT DOES NOT BUT A PRE D COULD BE PERFORMED AT OUR REQ.   CALL REF# NORMA S

## 2016-08-11 ENCOUNTER — Other Ambulatory Visit (HOSPITAL_COMMUNITY): Payer: Self-pay | Admitting: Emergency Medicine

## 2016-08-11 ENCOUNTER — Telehealth (HOSPITAL_COMMUNITY): Payer: Self-pay | Admitting: Emergency Medicine

## 2016-08-11 ENCOUNTER — Ambulatory Visit (HOSPITAL_COMMUNITY): Payer: Federal, State, Local not specified - PPO | Admitting: Oncology

## 2016-08-11 ENCOUNTER — Ambulatory Visit (HOSPITAL_COMMUNITY): Payer: Federal, State, Local not specified - PPO

## 2016-08-11 DIAGNOSIS — C3490 Malignant neoplasm of unspecified part of unspecified bronchus or lung: Secondary | ICD-10-CM

## 2016-08-11 NOTE — Telephone Encounter (Signed)
Called pt to let her know we wanted to get a PET scan before we start chemo.  PET scan in [place] 2/21 at 11:00 arrive at 10:30.  Then she will follow up with the doctor at 2/22 at 2:50pm.  Would probably start chemo on the 26th.  Explained she would need a port she states that she wants to check with [place] to get it put in.  I told her to call me if I could help.  I told her if she had a PICC line placed she would need to come her twice a week for flush and PICC line dressing change.  She said that she could not do that.  She wanted to know why or who she needed to talk to so she could get the etoposide sent to [place] so she could be treated in [place].    PET scan orders faxed to [place].

## 2016-08-12 ENCOUNTER — Ambulatory Visit (HOSPITAL_COMMUNITY): Payer: Federal, State, Local not specified - PPO

## 2016-08-13 ENCOUNTER — Ambulatory Visit (HOSPITAL_COMMUNITY): Payer: Federal, State, Local not specified - PPO

## 2016-08-14 ENCOUNTER — Ambulatory Visit (HOSPITAL_COMMUNITY): Payer: Federal, State, Local not specified - PPO

## 2016-08-19 ENCOUNTER — Encounter: Payer: Self-pay | Admitting: *Deleted

## 2016-08-19 ENCOUNTER — Encounter (HOSPITAL_COMMUNITY): Payer: Federal, State, Local not specified - PPO

## 2016-08-19 ENCOUNTER — Ambulatory Visit (HOSPITAL_COMMUNITY): Payer: Federal, State, Local not specified - PPO

## 2016-08-19 NOTE — Progress Notes (Signed)
[facility] Clinical Social Work  Clinical Social Work was referred by patient navigator for assessment of psychosocial needs due to starting treatment later this week. Clinical Social Worker contacted patient at home to offer support and assess for needs.  CSW introduced self, explained role of CSW and resources to assist. Pt has had some issues with transportation and has reached out to [employer] for assistance. They can help with two rides a week. Pt does report to have a car, but feels she cannot drive currently. CSW provided her with [employer] as additional transportation resource. She has decided to retire from her job through [employer] and feels she will continue with her federal health insurance. She has not received confirmation of this to date and CSW encouraged pt to contact her HR dept for assistance. Pt also thought she could get medicaid, if not and CSW educated pt on criteria to have medicaid. Pt shared she would not meet basic income guidelines. CSW also explained how ss disability worked, but would not provide medicare for 2 years after a stage IV diagnosis. Pt appeared confused by all of these options and was not sure about her pension as well. CSW encouraged pt to reach out to her employer to try to get some of these things in order before she starts treatment and is overwhlemed. CSW encouraged pt to reach out to CSW as needed for support and help with resources as well. Pt agreed. CSW to follow.   Clinical Social Work interventions: Resource education and referral.  Loren Racer, LCSW, OSW-C [facility]   [phone]

## 2016-08-20 ENCOUNTER — Encounter: Payer: Self-pay | Admitting: Family Medicine

## 2016-08-21 ENCOUNTER — Encounter (HOSPITAL_COMMUNITY): Payer: Federal, State, Local not specified - PPO | Attending: Oncology | Admitting: Oncology

## 2016-08-21 ENCOUNTER — Encounter (HOSPITAL_COMMUNITY): Payer: Self-pay | Admitting: Oncology

## 2016-08-21 VITALS — BP 147/62 | HR 78 | Temp 99.3°F | Resp 16 | Ht 67.0 in | Wt 128.0 lb

## 2016-08-21 DIAGNOSIS — Z880 Allergy status to penicillin: Secondary | ICD-10-CM | POA: Diagnosis not present

## 2016-08-21 DIAGNOSIS — J00 Acute nasopharyngitis [common cold]: Secondary | ICD-10-CM

## 2016-08-21 DIAGNOSIS — Z79899 Other long term (current) drug therapy: Secondary | ICD-10-CM | POA: Diagnosis not present

## 2016-08-21 DIAGNOSIS — Z7982 Long term (current) use of aspirin: Secondary | ICD-10-CM | POA: Insufficient documentation

## 2016-08-21 DIAGNOSIS — R05 Cough: Secondary | ICD-10-CM

## 2016-08-21 DIAGNOSIS — Z87891 Personal history of nicotine dependence: Secondary | ICD-10-CM | POA: Diagnosis not present

## 2016-08-21 DIAGNOSIS — C3432 Malignant neoplasm of lower lobe, left bronchus or lung: Secondary | ICD-10-CM

## 2016-08-21 DIAGNOSIS — Z7902 Long term (current) use of antithrombotics/antiplatelets: Secondary | ICD-10-CM | POA: Insufficient documentation

## 2016-08-21 DIAGNOSIS — Z881 Allergy status to other antibiotic agents status: Secondary | ICD-10-CM | POA: Insufficient documentation

## 2016-08-21 DIAGNOSIS — C3492 Malignant neoplasm of unspecified part of left bronchus or lung: Secondary | ICD-10-CM | POA: Diagnosis present

## 2016-08-21 DIAGNOSIS — Z85118 Personal history of other malignant neoplasm of bronchus and lung: Secondary | ICD-10-CM | POA: Diagnosis not present

## 2016-08-21 DIAGNOSIS — I10 Essential (primary) hypertension: Secondary | ICD-10-CM | POA: Diagnosis not present

## 2016-08-21 DIAGNOSIS — C3491 Malignant neoplasm of unspecified part of right bronchus or lung: Secondary | ICD-10-CM

## 2016-08-21 DIAGNOSIS — C3490 Malignant neoplasm of unspecified part of unspecified bronchus or lung: Secondary | ICD-10-CM

## 2016-08-21 DIAGNOSIS — Z803 Family history of malignant neoplasm of breast: Secondary | ICD-10-CM

## 2016-08-21 HISTORY — DX: Malignant neoplasm of unspecified part of unspecified bronchus or lung: C34.90

## 2016-08-21 HISTORY — DX: Malignant neoplasm of unspecified part of right bronchus or lung: C34.91

## 2016-08-21 LAB — CBC WITH DIFFERENTIAL/PLATELET
Basophils Absolute: 0 10*3/uL (ref 0.0–0.1)
Basophils Relative: 1 %
Eosinophils Absolute: 0.2 10*3/uL (ref 0.0–0.7)
Eosinophils Relative: 3 %
HEMATOCRIT: 38.7 % (ref 36.0–46.0)
Hemoglobin: 13.3 g/dL (ref 12.0–15.0)
LYMPHS ABS: 2.6 10*3/uL (ref 0.7–4.0)
LYMPHS PCT: 41 %
MCH: 30.4 pg (ref 26.0–34.0)
MCHC: 34.4 g/dL (ref 30.0–36.0)
MCV: 88.4 fL (ref 78.0–100.0)
MONOS PCT: 9 %
Monocytes Absolute: 0.6 10*3/uL (ref 0.1–1.0)
NEUTROS ABS: 3 10*3/uL (ref 1.7–7.7)
Neutrophils Relative %: 46 %
Platelets: 267 10*3/uL (ref 150–400)
RBC: 4.38 MIL/uL (ref 3.87–5.11)
RDW: 15 % (ref 11.5–15.5)
WBC: 6.3 10*3/uL (ref 4.0–10.5)

## 2016-08-21 LAB — COMPREHENSIVE METABOLIC PANEL
ALT: 18 U/L (ref 14–54)
ANION GAP: 10 (ref 5–15)
AST: 21 U/L (ref 15–41)
Albumin: 4 g/dL (ref 3.5–5.0)
Alkaline Phosphatase: 93 U/L (ref 38–126)
BUN: 10 mg/dL (ref 6–20)
CHLORIDE: 101 mmol/L (ref 101–111)
CO2: 24 mmol/L (ref 22–32)
Calcium: 10.9 mg/dL — ABNORMAL HIGH (ref 8.9–10.3)
Creatinine, Ser: 0.73 mg/dL (ref 0.44–1.00)
GFR calc non Af Amer: >60 mL/min (ref >60)
Glucose, Bld: 108 mg/dL — ABNORMAL HIGH (ref 65–99)
Potassium: 4 mmol/L (ref 3.5–5.1)
SODIUM: 135 mmol/L (ref 135–145)
Total Bilirubin: 0.5 mg/dL (ref 0.3–1.2)
Total Protein: 7.9 g/dL (ref 6.5–8.1)

## 2016-08-21 LAB — MAGNESIUM: MAGNESIUM: 1.9 mg/dL (ref 1.7–2.4)

## 2016-08-21 LAB — LACTATE DEHYDROGENASE: LDH: 148 U/L (ref 98–192)

## 2016-08-21 MED ORDER — HYDROCOD POLST-CPM POLST ER 10-8 MG/5ML PO SUER: 5.0000 mL | Freq: Two times a day (BID) | ORAL | 0 refills | Status: AC | PRN

## 2016-08-21 MED ORDER — LEVOFLOXACIN 500 MG PO TABS: 500.0000 mg | ORAL_TABLET | Freq: Every day | ORAL | 0 refills | Status: DC

## 2016-08-21 NOTE — Progress Notes (Signed)
START ON PATHWAY REGIMEN - Small Cell Lung     A cycle is every 21 days:     Etoposide        Dose Mod: None     Cisplatin        Dose Mod: None  **Always confirm dose/schedule in your pharmacy ordering system**    Patient Characteristics: Limited Stage, First Line Stage Grouping: Limited AJCC T Category: T1c AJCC N Category: N1 AJCC M Category: M0 AJCC 8 Stage Grouping: IIB Line of therapy: First Line Would you be surprised if this patient died  in the next year? I would be surprised if this patient died in the next year  Intent of Therapy: Curative Intent, Discussed with Patient

## 2016-08-21 NOTE — Progress Notes (Signed)
[facility] Hematology/Oncology Consultation   Name: Holly Knight      MRN: [id number]    Location: Room/bed info not found  Date: 08/21/2016 Time:7:28 PM   REFERRING PHYSICIAN:  Laurena Slimmer, MD (Med Onc in [place], [place])  [place]:  Newly diagnosed, limited stage small cell lung cancer.   DIAGNOSIS:  Newly diagnosed, limited stage small cell lung cancer.  HISTORY OF PRESENT ILLNESS:   Holly Knight is a 56 y.o. female with a medical history significant for hypertension, history of ulnar embolism, carotid artery stenosis, history of one congenital kidney, significant back issues hindering ability to ambulate who is referred to the [facility] for newly diagnosed, limited stage small cell lung cancer with PET repeat on 08/20/2016 showing a stable 1.2 cm nodule in the right lung apex which demonstrates moderate to intense FDG avidity, interval increase in size of left hilar lymphadenopathy with intense FDG avidity, and stable 9 mm minimally to mildly FDG avid pulmonary nodule in the left lower lobe.  No new sites of disease within the abdomen or pelvis or osseous structures.    Small cell lung cancer ([place])   05/21/2016 Imaging    CT chest wo contrast in [place], [place]-increased size of the nodule in the right lung apex.  The postsurgical changes, scarring and interstitial fibrotic changes in the right lung are otherwise stable.  Repeat surveillance evaluation of the right lung in 3-4 months with CT chest is recommended.  Stable prominent mediastinal lymph nodes.  No new adenopathy within the mediastinum.  There are stable chronic changes within the abdomen.      06/11/2016 PET scan    PET in [place], [place]- Slightly spiculated nodule within the posterior aspect of the right lung apex described on previous study is FDG avid with a maximal SUV of 6.43.  There has also been interval development of FDG avid mass in the left hilar region  when compared to prior study.  The nodule within the apex of the right upper lobe is FDG avid and has increased in size described on prior CTs.  These findings are concerning for recurrent disease.  FDG avid nodules within the left hilar region likely representing lymph nodes.  Differential considerations are metastatic lymph nodes versus active lymph nodes from infection or inflammatory changes.      07/29/2016 Pathology Results    Left hilar lymph node (level 10L)- positive for neoplasia.  A small panel of immunostains is performed to confirm the diagnosis.  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      08/20/2016 PET scan    PET in [place], [place]- stable 1.2 cm nodule in the right lung apex which demonstrates moderate to intense FDG avidity, interval increase in size of left hilar lymphadenopathy with intense FDG avidity, and stable 9 mm minimally to mildly FDG avid pulmonary nodule in the left lower lobe.  No new sites of disease within the abdomen or pelvis or osseous structures.        Squamous cell carcinoma of right lung ([place])   01/21/2013 PET scan    PET scan and [place], [place]-hypermetabolic activity corresponding to the right upper lobe malignancy, SUV 8.3.  Also linear increased uptake identified within the anterior and left lateral aspect of the base of tongue maximum SUV of  15.  No evidence of any other hypermetabolic activity identified.  The area of increased uptake at the left lateral base of tongue was stated to potentially represent inflammation, but direct visualization was recommended.      02/08/2013 Miscellaneous    Approximate date.  Patient underwent direct laryngoscopy by ENT no lesion identified in her oropharynx.      03/13/2013 Procedure    Right upper lobectomy with small wedge of the right lower lobe as well as lymph node  dissection with right tracheobronchial level 7 subcarinal and right lower paratracheal 4R and azygous lymph node biopsies.  All lymph node samples were negative for tumor.  Patient noted to have squamous cell carcinoma of right upper lobe measuring 2 cm.  Surgical margins were free of tumor.  No alvei was identified.  Tumor did extend to the visceropleural but did not invade.  Patient's ultimate staging was pT1 pN0 M0      04/07/2013 Pathology Results    Pathology from right upper lobectomy with small wedge of the right lower lobe as well as lymph node dissection with right tracheobronchial level 7 subcarinal and right lower paratracheal 4R and azygous lymph node biopsies.  All lymph node samples were negative for tumor.  Patient noted to have squamous cell carcinoma of right upper lobe measuring 2 cm.  Surgical margins were free of tumor.  No alvei was identified.  Tumor did extend to the visceropleural but did not invade.  Patient's ultimate staging was pT1 pN0 M0       She is here today with newly diagnosed small cell lung cancer and a biopsy-proven left hilar lymph node.  PET imaging in [place], [place] demonstrated a stable 1.2 cm nodule in the right lung apex with hypermetabolic activity, interval increase in size of left hilar lymphadenopathy with intense hypermetabolic activity and a stable 9 mm minimally to mildly hypermetabolic in the left lower lobe without any other sites of metastatic disease.  Her initial PET scan was back in December followed by a biopsy in [place] on July 29, 2016.  Due to the significant time difference between PET imaging and biopsy-proven disease, we have repeated a PET scan that is outlined above.  She also has a history of early stage squamous cell carcinoma of the right upper lobe that was treated surgically with lobectomy in 2014.  Patient really denies any oncology complaints.  She reports a cough with some internal itching of her throat and chest.  She  notes it is very difficult to describe the sensation.  She also notes that her cough is productive of yellow/green sputum.  She denies any fevers or chills.  She notes that her appetite is good but she has lost some weight over the last couple of months.  She denies any headaches or visual changes.  She denies any hemoptysis.  Patient is well-educated and asks a number of directed questions which were answered to her satisfaction.  She is educated about her diagnosis, small cell lung cancer.  She is educated about her stage of disease which is limited stage.  Given that she has limited stage disease, we discussed treatment options including a goal of cure.  She will be treated in the typical fashion with cisplatin/etoposide in addition to concomitant radiation.  She is agreeable with this plan.  Review of Systems  Constitutional: Negative.  Negative for chills, fever and weight loss.  HENT: Negative.   Eyes: Negative.  Negative for blurred vision and double vision.  Respiratory: Positive for cough and sputum production. Negative for hemoptysis and shortness of breath.   Cardiovascular: Negative.  Negative for chest pain.  Gastrointestinal: Negative.  Negative for blood in stool, constipation, diarrhea, melena, nausea and vomiting.  Genitourinary: Negative.   Musculoskeletal: Negative.  Negative for falls.  Skin: Negative.   Neurological: Negative.  Negative for dizziness, seizures, loss of consciousness, weakness and headaches.  Endo/Heme/Allergies: Negative.   Psychiatric/Behavioral: Negative.      PAST MEDICAL HISTORY:   Past Medical History:  Diagnosis Date  . Anxiety   . BPPV (benign paroxysmal positional vertigo)    takes valium  . Cancer ([place]) 01/2013  . Complication of anesthesia 02/10/2013   BP dropped too low and had a sroke  . COPD (chronic obstructive pulmonary disease) ([place])   . Coronary artery disease   . H/O hiatal hernia   . Hypertension   . Lung cancer ([place])   .  Peripheral vascular disease ([facility])    PAD-   . Pulmonary embolism ([place]) 1985ish   due to birth control pills  . Small cell lung cancer ([place]) 08/21/2016  . 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 Stroke [facility]) 2014   left hand weakness - PT     ALLERGIES: Allergies  Allergen Reactions  . Penicillins Anaphylaxis  . Sulfa Antibiotics Rash      MEDICATIONS: I have reviewed the patient's current medications.    Current Outpatient Prescriptions on File Prior to Visit  Medication Sig Dispense Refill  . amLODipine-benazepril (LOTREL) 5-20 MG per capsule Take 1 capsule by mouth 2 (two) times daily.    Marland Kitchen aspirin EC 81 MG tablet Take 81 mg by mouth daily.    Marland Kitchen atorvastatin (LIPITOR) 20 MG tablet Take 20 mg by mouth at bedtime.    . baclofen (LIORESAL) 10 MG tablet Take 10 mg by mouth every 4 (four) hours.    . clopidogrel (PLAVIX) 75 MG tablet Take 75 mg by mouth daily.    . diazepam (VALIUM) 2 MG tablet Take 2 mg by mouth at bedtime as needed for anxiety.    . furosemide (LASIX) 40 MG tablet Take 40 mg by mouth 2 (two) times daily.    . hydrALAZINE (APRESOLINE) 50 MG tablet Take 50 mg by mouth 2 (two) times daily.    . mirtazapine (REMERON) 15 MG tablet Take 15 mg by mouth at bedtime.    . topiramate (TOPAMAX) 50 MG tablet Take 50 mg by mouth 2 (two) times daily.     No current facility-administered medications on file prior to visit.      PAST SURGICAL HISTORY Past Surgical History:  Procedure Laterality Date  . BACK SURGERY    . CERVICAL SPINE SURGERY    . CORONARY ANGIOPLASTY  12/2011    stent  . CYST EXCISION Right 1989   breast  . SALIVARY STONE REMOVAL N/A 04/13/2013   Procedure: INTRAORAL EXCISION SUBMANDIBULAR STONES ;  Surgeon: Izora Gala, MD;  Location: [facility];  Service: ENT;  Laterality: N/A;  . TUMOR REMOVAL Right 02/10/2013   lung  . UTERINE FIBROID EMBOLIZATION  2005  . UTERINE FIBROID SURGERY     x2    FAMILY HISTORY: Family History  Problem  Relation Age of Onset  . Cancer Mother     breast  . Seizures Sister     SOCIAL HISTORY:  reports that she quit smoking about 3 years ago. Her smoking use included Cigarettes. She quit after 35.00 years  of use. She has never used smokeless tobacco. She reports that she does not drink alcohol or use drugs.  Social History   Social History  . Marital status: Divorced    Spouse name: N/A  . Number of children: N/A  . Years of education: N/A   Social History Main Topics  . Smoking status: Former Smoker    Years: 35.00    Types: Cigarettes    Quit date: 02/10/2013  . Smokeless tobacco: Never Used  . Alcohol use No     Comment: 2- 3 a month  . Drug use: No  . Sexual activity: Not Currently   Other Topics Concern  . None   Social History Narrative  . None    PERFORMANCE STATUS: The patient's performance status is 2 - Symptomatic, <50% confined to bed  PHYSICAL EXAM: Most Recent Vital Signs: Blood pressure (!) 147/62, pulse 78, temperature 99.3 F (37.4 C), temperature source Oral, resp. rate 16, height 5' 7"  (1.702 m), weight 128 lb (58.1 kg), SpO2 94 %. General appearance: alert, cooperative, appears stated age, no distress and In wheelchair, unaccompanied in exam room. Head: Normocephalic, without obvious abnormality, atraumatic Eyes: negative findings: lids and lashes normal, conjunctivae and sclerae normal and corneas clear Ears: normal TM's and external ear canals both ears Nose: Nares normal. Septum midline. Mucosa normal. No drainage or sinus tenderness. Throat: lips, mucosa, and tongue normal; teeth and gums normal Neck: no adenopathy and supple, symmetrical, trachea midline Lungs: clear to auscultation bilaterally and normal percussion bilaterally Heart: regular rate and rhythm, S1, S2 normal, no murmur, click, rub or gallop Abdomen: soft, non-tender; bowel sounds normal; no masses,  no organomegaly Extremities: extremities normal, atraumatic, no cyanosis or  edema Skin: Skin color, texture, turgor normal. No rashes or lesions Lymph nodes: Cervical, supraclavicular, and axillary nodes normal. Neurologic: Grossly normal  LABORATORY DATA:  Results for orders placed or performed in visit on 08/21/16 (from the past 48 hour(s))  CBC with Differential     Status: None   Collection Time: 08/21/16  4:36 PM  Result Value Ref Range   WBC 6.3 4.0 - 10.5 K/uL   RBC 4.38 3.87 - 5.11 MIL/uL   Hemoglobin 13.3 12.0 - 15.0 g/dL   HCT 38.7 36.0 - 46.0 %   MCV 88.4 78.0 - 100.0 fL   MCH 30.4 26.0 - 34.0 pg   MCHC 34.4 30.0 - 36.0 g/dL   RDW 15.0 11.5 - 15.5 %   Platelets 267 150 - 400 K/uL   Neutrophils Relative % 46 %   Neutro Abs 3.0 1.7 - 7.7 K/uL   Lymphocytes Relative 41 %   Lymphs Abs 2.6 0.7 - 4.0 K/uL   Monocytes Relative 9 %   Monocytes Absolute 0.6 0.1 - 1.0 K/uL   Eosinophils Relative 3 %   Eosinophils Absolute 0.2 0.0 - 0.7 K/uL   Basophils Relative 1 %   Basophils Absolute 0.0 0.0 - 0.1 K/uL  Comprehensive metabolic panel     Status: Abnormal   Collection Time: 08/21/16  4:36 PM  Result Value Ref Range   Sodium 135 135 - 145 mmol/L   Potassium 4.0 3.5 - 5.1 mmol/L   Chloride 101 101 - 111 mmol/L   CO2 24 22 - 32 mmol/L   Glucose, Bld 108 (H) 65 - 99 mg/dL   BUN 10 6 - 20 mg/dL   Creatinine, Ser 0.73 0.44 - 1.00 mg/dL   Calcium 10.9 (H) 8.9 - 10.3 mg/dL  Total Protein 7.9 6.5 - 8.1 g/dL   Albumin 4.0 3.5 - 5.0 g/dL   AST 21 15 - 41 U/L   ALT 18 14 - 54 U/L   Alkaline Phosphatase 93 38 - 126 U/L   Total Bilirubin 0.5 0.3 - 1.2 mg/dL   GFR calc non Af Amer >60 >60 mL/min   GFR calc Af Amer >60 >60 mL/min    Comment: (NOTE) The eGFR has been calculated using the CKD EPI equation. This calculation has not been validated in all clinical situations. eGFR's persistently <60 mL/min signify possible Chronic Kidney Disease.    Anion gap 10 5 - 15  Lactate dehydrogenase     Status: None   Collection Time: 08/21/16  4:36 PM  Result  Value Ref Range   LDH 148 98 - 192 U/L  Magnesium     Status: None   Collection Time: 08/21/16  4:36 PM  Result Value Ref Range   Magnesium 1.9 1.7 - 2.4 mg/dL      RADIOGRAPHY: No results found.                             PATHOLOGY:            ASSESSMENT/PLAN:   Small cell lung cancer ([place]) Newly diagnosed, limited stage small cell lung cancer.  Performance status is difficult to calculate due to patient's inability to ambulate well secondary to back issues.  She has had difficulties getting an appointment with neurosurgery as her neurosurgeon in [place] committed suicide.  As we make her way through treatment, we will refer her to neurosurgery accordingly.  Oncology history is developed.  Staging in [facility] problem list completed.  I personally reviewed and went over radiographic studies with the patient.  The results are noted within this dictation.  We will get a copy of her MRI of brain report from [place], [place] on 08/19/2016.  I do not have that copy at this time.  I personally reviewed and went over pathology results with the patient.  Patient provided education regarding her newly diagnosed limited stage small cell lung cancer.  She is advised that intent of treatment is curative.  She is educated on recommended treatment consisting of concomitant chemoradiation with cisplatin and etoposide chemotherapy.  I reviewed the risks, benefits, alternatives, and side effects of chemotherapy including, but not limited to, nausea, vomiting, diarrhea, constipation, alopecia, fatigue, tiredness, decrease in blood counts, increased risk of infection, change in renal function, change in liver function, anaphylaxis, and death.  I reviewed the role of intravenous access for chemotherapy administration.  Given the limited amount of time we have, she will get a PICC placed on Monday and start chemotherapy on Monday.  Based upon the timing of PICC line placement, she will  start with cisplatin/etoposide for day 1.  If PICC line inhibits her ability to give her both chemotherapeutic drugs on day 1, she will get single agent etoposide on day 1, cycle 1 and received cisplatin on day 2, cycle 1.  Using via pathway, her chemotherapy regimen is built accordingly.  Her PICC line will be removed on day 3 of cycle 1.  She will be referred to Dr. Arnoldo Morale for port placement in approximately 3 weeks time prior to start of cycle #2.  We will refer the patient to radiation oncology for consultation regarding concomitant radiation.  She is provided patient information regarding her diagnosis and disease.  She will be  referred for chemotherapy teaching.  For her productive cough with sputum production, I provided her an antibiotic, Levaquin.  Additionally, she is given a prescription for Tussionex for her cough.  Her PET scan was unrevealing for any active infection.  Her allergy list is updated accordingly and she is allergic to penicillins and sulfa antibiotics.  She will return for nadir check on approximately day 8- day 10 of cycle #1 with labs.   ORDERS PLACED FOR THIS ENCOUNTER: Orders Placed This Encounter  Procedures  . CBC with Differential  . Comprehensive metabolic panel  . Lactate dehydrogenase  . Magnesium  . CBC with Differential  . Comprehensive metabolic panel  . CBC with Differential  . Comprehensive metabolic panel  . Magnesium    MEDICATIONS PRESCRIBED THIS ENCOUNTER: Meds ordered this encounter  Medications  . Cholecalciferol (VITAMIN D3) 2000 units capsule    Sig: Take by mouth.  . cyclobenzaprine (FLEXERIL) 10 MG tablet    Sig: TAKE 1 TABLET THREE TIMES A DAY AS NEEDED FOR MUSCLE SPASMS  . folic acid (FOLVITE) 1 MG tablet    Sig: 1 mg  . tiotropium (SPIRIVA HANDIHALER) 18 MCG inhalation capsule    Sig: 18 mcg  . DISCONTD: amLODipine-benazepril (LOTREL) 5-20 MG capsule    Sig: Take by mouth.  . DISCONTD: baclofen (LIORESAL) 10 MG  tablet    Sig: Take by mouth.  . budesonide-formoterol (SYMBICORT) 160-4.5 MCG/ACT inhaler    Sig: 2 puff by inhalation twice daily as needed  . DISCONTD: diazepam (VALIUM) 2 MG tablet    Sig: Take by mouth.  . DISCONTD: hydrALAZINE (APRESOLINE) 50 MG tablet    Sig: Take by mouth.  . DISCONTD: traMADol (ULTRAM) 50 MG tablet    Sig: Take by mouth.  Marland Kitchen azelastine (ASTELIN) 0.1 % nasal spray  . fluticasone (FLONASE) 50 MCG/ACT nasal spray  . ipratropium-albuterol (DUONEB) 0.5-2.5 (3) MG/3ML SOLN  . l-methylfolate-B6-B12 (METANX) 3-35-2 MG TABS tablet  . DISCONTD: mirtazapine (REMERON) 30 MG tablet  . omeprazole (PRILOSEC) 20 MG capsule  . levofloxacin (LEVAQUIN) 500 MG tablet    Sig: Take 1 tablet (500 mg total) by mouth daily.    Dispense:  5 tablet    Refill:  0    Order Specific Question:   Supervising Provider    Answer:   Brunetta Genera [[id number]]  . chlorpheniramine-HYDROcodone (TUSSIONEX PENNKINETIC ER) 10-8 MG/5ML SUER    Sig: Take 5 mLs by mouth every 12 (twelve) hours as needed for cough.    Dispense:  140 mL    Refill:  0    Order Specific Question:   Supervising Provider    Answer:   Brunetta Genera [[id number]]    All questions were answered. The patient knows to call the clinic with any problems, questions or concerns. We can certainly see the patient much sooner if necessary.  Patient discussed with Dr. Talbert Cage and together we ascertained an up-to-date interval history, and examined the patient.  Dr. Talbert Cage developed the patient's assessment and plan.  This was a shared visit-consultation.  Her attestation will follow below.  This note is electronically signed by: Doy Mince 08/21/2016 7:28 PM

## 2016-08-21 NOTE — Patient Instructions (Addendum)
Early at [facility] Discharge Instructions  RECOMMENDATIONS MADE BY THE CONSULTANT AND ANY TEST RESULTS WILL BE SENT TO YOUR REFERRING PHYSICIAN.  You were seen today by Kirby Crigler PA-C.  Labs today, we will call you with results. PICC placement on Monday. Chemo Monday-Wednesday. Refer to Radiation therapy. Refer to Dr. Arnoldo Morale for port placement. Return after chemo for follow up.     Thank you for choosing [facility] at [facility] to provide your oncology and hematology care.  To afford each patient quality time with our provider, please arrive at least 15 minutes before your scheduled appointment time.    If you have a lab appointment with the [facility] please come in thru the  Main Entrance and check in at the main information desk  You need to re-schedule your appointment should you arrive 10 or more minutes late.  We strive to give you quality time with our providers, and arriving late affects you and other patients whose appointments are after yours.  Also, if you no show three or more times for appointments you may be dismissed from the clinic at the providers discretion.     Again, thank you for choosing [facility].  Our hope is that these requests will decrease the amount of time that you wait before being seen by our physicians.       _____________________________________________________________  Should you have questions after your visit to [facility], please contact our office at [phone] between the hours of 8:30 a.m. and 4:30 p.m.  Voicemails left after 4:30 p.m. will not be returned until the following business day.  For prescription refill requests, have your pharmacy contact our office.       Resources For Cancer Patients and their Caregivers ? American Cancer Society: Can assist with transportation, wigs, general needs, runs Look Good Feel Better.         (848) 540-9601 ? Cancer Care: Provides financial assistance, online support groups, medication/co-pay assistance.  1-800-813-HOPE (820)455-7051) ? [facility] Assists [place] cancer patients and their families through emotional , educational and financial support.  (650)748-9694 ? [place] DSS Where to apply for food stamps, Medicaid and utility assistance. 534-353-5644 ? [facility]: Transportation to medical appointments. 867-499-6624 ? Social Security Administration: May apply for disability if have a Stage IV cancer. (319)712-8337 623-268-7256 ? [employer], Disability and Transit Services: Assists with nutrition, care and transit needs. [place] Support Programs: '@10RELATIVEDAYS'$ @ > Cancer Support Group  2nd Tuesday of the month 1pm-2pm, Journey Room  > Creative Journey  3rd Tuesday of the month 1130am-1pm, Journey Room  > Look Good Feel Better  1st Wednesday of the month 10am-12 noon, Journey Room (Call [place] to register [phone])

## 2016-08-21 NOTE — Assessment & Plan Note (Signed)
Newly diagnosed, limited stage small cell lung cancer.  Performance status is difficult to calculate due to patient's inability to ambulate well secondary to back issues.  She has had difficulties getting an appointment with neurosurgery as her neurosurgeon in [place] committed suicide.  As we make her way through treatment, we will refer her to neurosurgery accordingly.  Oncology history is developed.  Staging in [facility] problem list completed.  I personally reviewed and went over radiographic studies with the patient.  The results are noted within this dictation.  We will get a copy of her MRI of brain report from [place], [place] on 08/19/2016.  I do not have that copy at this time.  I personally reviewed and went over pathology results with the patient.  Patient provided education regarding her newly diagnosed limited stage small cell lung cancer.  She is advised that intent of treatment is curative.  She is educated on recommended treatment consisting of concomitant chemoradiation with cisplatin and etoposide chemotherapy.  I reviewed the risks, benefits, alternatives, and side effects of chemotherapy including, but not limited to, nausea, vomiting, diarrhea, constipation, alopecia, fatigue, tiredness, decrease in blood counts, increased risk of infection, change in renal function, change in liver function, anaphylaxis, and death.  I reviewed the role of intravenous access for chemotherapy administration.  Given the limited amount of time we have, she will get a PICC placed on Monday and start chemotherapy on Monday.  Based upon the timing of PICC line placement, she will start with cisplatin/etoposide for day 1.  If PICC line inhibits her ability to give her both chemotherapeutic drugs on day 1, she will get single agent etoposide on day 1, cycle 1 and received cisplatin on day 2, cycle 1.  Using via pathway, her chemotherapy regimen is built accordingly.  Her PICC line will be removed on day 3 of  cycle 1.  She will be referred to Dr. Arnoldo Morale for port placement in approximately 3 weeks time prior to start of cycle #2.  We will refer the patient to radiation oncology for consultation regarding concomitant radiation.  She is provided patient information regarding her diagnosis and disease.  She will be referred for chemotherapy teaching.  For her productive cough with sputum production, I provided her an antibiotic, Levaquin.  Additionally, she is given a prescription for Tussionex for her cough.  Her PET scan was unrevealing for any active infection.  Her allergy list is updated accordingly and she is allergic to penicillins and sulfa antibiotics.  She will return for nadir check on approximately day 8- day 10 of cycle #1 with labs.

## 2016-08-22 ENCOUNTER — Other Ambulatory Visit (HOSPITAL_COMMUNITY): Payer: Federal, State, Local not specified - PPO

## 2016-08-22 ENCOUNTER — Telehealth (HOSPITAL_COMMUNITY): Payer: Self-pay | Admitting: Emergency Medicine

## 2016-08-22 ENCOUNTER — Encounter (HOSPITAL_COMMUNITY): Payer: Self-pay | Admitting: Lab

## 2016-08-22 ENCOUNTER — Encounter (HOSPITAL_COMMUNITY): Payer: Self-pay | Admitting: Adult Health

## 2016-08-22 ENCOUNTER — Ambulatory Visit (HOSPITAL_COMMUNITY): Payer: Federal, State, Local not specified - PPO

## 2016-08-22 MED ORDER — ONDANSETRON HCL 8 MG PO TABS: 8.0000 mg | ORAL_TABLET | Freq: Two times a day (BID) | ORAL | 1 refills | Status: AC | PRN

## 2016-08-22 MED ORDER — LORAZEPAM 0.5 MG PO TABS
0.5000 mg | ORAL_TABLET | Freq: Four times a day (QID) | ORAL | 0 refills | Status: AC | PRN
Start: 2016-08-22 — End: ?

## 2016-08-22 MED ORDER — DEXAMETHASONE 4 MG PO TABS: ORAL_TABLET | ORAL | 1 refills | Status: AC

## 2016-08-22 MED ORDER — LIDOCAINE-PRILOCAINE 2.5-2.5 % EX CREA: TOPICAL_CREAM | CUTANEOUS | 3 refills | Status: AC

## 2016-08-22 MED ORDER — PROCHLORPERAZINE MALEATE 10 MG PO TABS: 10.0000 mg | ORAL_TABLET | Freq: Four times a day (QID) | ORAL | 1 refills | Status: AC | PRN

## 2016-08-22 NOTE — Patient Instructions (Signed)
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  We are going to treat you with cisplatin and etoposide.  Cisplatin is day 1 every 21 days.  Etoposide is day 1, day 2, and day 3 every 21 days.  1 cycle is 21 days.  With the cisplatin treatment you will get 2 hours of hydration prior to receiving that drug and 2 hours of hydration fluid after.  This will help protect your kidneys.  This treatment is with curative intent.  We have referred you to [facility] to start radiation while you are having chemotherapy.  You will get a port prior to your next treatment with Dr Arnoldo Morale.  You will see the doctor regularly throughout treatment.  We monitor your lab work prior to every treatment.  The doctor monitors your response to treatment by the way you are feeling, your blood work, and scans periodically.   You will receive the following premedications prior to getting chemotherapy: Premeds: Aloxi - high powered nausea/vomiting prevention medication used for chemotherapy patients. Emend - high powered nausea/vomiting prevention medication used for chemotherapy patients. Dexamethasone - steroid - given to reduce the risk of you having an allergic type reaction to the chemotherapy. Dex can cause you to feel energized, nervous/anxious/jittery, make you have trouble sleeping, and/or make you feel hot/flushed in the face/neck and/or look pink/red in the face/neck. These side effects will pass as the Dex wears off. (takes 20 minutes to infuse)   POTENTIAL SIDE EFFECTS OF TREATMENT: Cisplatin (Generic Name) Other Names: Platinol, platinum  About This Drug Cisplatin is a drug used to treat cancer. This drug is given in the vein (IV). Takes 1 hour to infuse.  Possible Side Effects (More Common) . This drug may affect how your kidneys work. Your kidney function will be checked as needed. . Electrolyte changes. Your blood will  be checked for electrolyte changes as needed. . High-frequency hearing loss may occur. You will get IV fluids before and during the Cisplatin infusion to help prevent this. You may also get ringing in the ears. . Bone marrow depression. This is a decrease in the number of white blood cells, red blood cells, and platelets. This may raise your risk of infection, make you tired and weak (fatigue), and raise your risk of bleeding. . Nausea and throwing up (vomiting). These symptoms may happen within a few hours after your treatment and may last for a few days to a week. Medicines are available to stop or lessen these side effects.  Possible Side Effects (Less Common) . Effects on the nerves are called peripheral neuropathy. You may feel numbness or pain in your hands and feet. It may be hard for you to button your clothes, open jars, or walk as usual. The effect on the nerves may get worse with more doses of the drug. These effects get better in some people after the drug is stopped, but it does not get better in all people. Marland Kitchen Blurred vision or other changes in eyesight. . Soreness of the mouth and throat. You may have red areas, white patches, or sores that hurt. . Hair loss. You may notice your hair getting thin. Some patients lose their hair. Your hair often grows back when treatment is done.  Allergic Reactions Allergic reactions to this drug are rare, but may happen in some patients. Signs of allergic reactions to this drug may be a rash, fever, chills, feeling  dizzy, trouble breathing, and/or feeling that your heart is beating in a fast or not normal way.  Treating Side Effects . Drink 6-8 cups of fluids each day unless your doctor has told you to limit your fluid intake due to some other health problem. A cup is 8 ounces of fluid. If you throw up or have loose bowel movements you should drink more fluids so that you do not become dehydrated (lack water in the body due to losing too much fluid). .  If you have numbness and tingling in your hands and feet, be careful when cooking, walking, and handling sharp objects and hot liquids. . Mouth care is very important. Your mouth care should consist of routine, gentle cleaning of your teeth or dentures and rinsing your mouth with a mixture of 1/2 teaspoon of salt in 8 ounces of water or  teaspoon of baking soda in 8 ounces of water. This should be done at least after each meal and at bedtime. . If you have mouth sores, avoid mouthwash that has alcohol. Also avoid alcohol and smoking because they can bother your mouth and throat. . Talk with your nurse about getting a wig before you lose your hair. Also, call the [facility] at 800-ACS-2345 to find out information about the "Look Good, Feel Better" program close to where you live. It is a free program where women getting chemotherapy can learn about wigs, turbans and scarves as well as makeup techniques and skin and nail care.  Food and Drug Interactions There are no known interactions of Cisplatin with food. This drug may interact with other medicines. Tell your doctor and pharmacist about all the medicines and dietary supplements (vitamins, minerals, herbs and others) that you are taking at this time. The safety and use of dietary supplements and alternative diets are often not known. Using these might affect your cancer or interfere with your treatment. Until more is known, you should not use dietary supplements or alternative diets without your cancer doctor's help.  When to Call the Doctor Call your doctor or nurseright awayif you have any of these symptoms: . Rash or itching . Feeling dizzy or lightheaded . Wheezing or trouble breathing . Swelling of the face . Fever of 100.5 F (38 C) or above . Chills . Easy bleeding or bruising . Decreased urine . Weight gain of 5 pounds in one week (fluid retention) . Nausea that stops you from eating or drinking . Throwing up more than 3  times a day Call your doctor or nurse as soon as possible if you have these symptoms: . Numbness, tingling, decreased feeling or weakness in fingers, toes, arms, or legs . Trouble walking or changes in the way you walk, feeling clumsy when buttoning clothes, opening jars, or other routine hand motions . Blurred vision or other changes in eyesight . Changes in hearing, ringing in the ears . Pain in your mouth or throat that makes it hard to eat or drink . Fatigue that interferes with your daily activities  Sexual Problems and Reproductive Concerns . Infertility warning: Sexual problems and reproduction concerns may occur. In both men and women, this drug may affect your ability to have children. This cannot be determined before your treatment. Speak with your doctor or nurse if you plan to have children. Ask for information on sperm or egg banking. . In men, this drug may interfere with your ability to make sperm, but it should not change your ability to have sexual  relations. . In women, menstrual bleeding may become irregular or stop while you are receiving this drug. Do not assume that you cannot become pregnant if you do not have a menstrual period. . Women may experience signs of menopause like vaginal dryness, itching, and pain during sexual relations . Genetic counseling is available for you to talk about the effects of this drug therapy on future pregnancies. Also, a genetic counselor can look at the possible risk of problems in the unborn baby due to this medicine if an exposure happens during pregnancy. . Pregnancy warning: The drug may have harmful effects on the unborn child, so effective methods of birth control should be used during your cancer treatment. . Breast feeding warning: Women should not breast feed during treatment because this drug could enter the breast milk and badly harm a breast feeding baby.   Etoposide (Generic Name) Other Names: VePesid, VP-16  About This  Drug Etoposide is a drug used to treat cancer. This drug is given in the vein (IV) or by mouth.  Takes 1 hour to infuse.    Possible Side Effects (More Common) . Nausea and throwing up (vomiting). These symptoms may happen within one to six hours after getting this drug and may last up to 72 hours. Medicines are available to stop or lessen these side effects . Bone marrow depression. This is a decrease in the number of white blood cells, red blood cells, and platelets. This may raise your risk of infection, make you tired and weak (fatigue), and raise your risk of bleeding. . Decreased appetite (decreased hunger) . Hair loss. Most patients lost hair on their scalp and body. You may notice hair thinning five to seven days after getting this drug. Often hair loss is temporary; your hair should grow back when treatment is done. . Fatigue  Possible Side Effects (Less Common) . Loose bowel movements (diarrhea) that may last for several days . Soreness of the mouth and throat. You may have red areas, white patches, or sores that hurt. . Increased total bilirubin in your blood. This may mean that you have changes in your liver function. Your blood work will be checked by your doctor. . Effects on the nerves are called peripheral neuropathy. You may feel numbness, tingling, or pain in your hands and feet. It may be hard for you to button your clothes, open jars, or walk as usual. The effect on the nerves may get worse with more doses of the drug. These effects get better in some people after the drug is stopped but it does not get better in all people. . Rash . Blood pressure may be low while getting this drug in an IV.  Allergic Reactions Serious allergic reactions including anaphylaxis are rare. While you are getting this drug in your vein (IV), tell your nurse right away if you have any of these symptoms of an allergic reaction: . trouble catching your breath . feeling like your tongue or throat are  swelling . feeling your heart beat quickly or in a not normal way (palpitations) . feeling dizzy or lightheaded . flushing, itching, rash, and/or hives  Treating Side Effects . If you are getting this drug in an IV, tell your nurse knowright away if you are feeling lightheaded or dizzy. . Talk with your nurse about getting a wig before you lose your hair. Also, call the [facility] at 800-ACS-2345 to find out information about the "Look Good, Feel Better" program close to where  you live. It is a free program where women getting chemotherapy can learn about wigs, turbans and scarves as well as makeup techniques and skin and nail care. . Drink 6-8 cups of fluids each day unless your doctor has told you to limit your fluid intake due to some other health problem. A cup is 8 ounces of fluid. If you throw up or have loose bowel movements, you should drink more fluids so that you do not become dehydrated (lack water in the body from losing too much fluid). . Mouth care is very important. Your mouth care should consist of regular, gentle cleaning of your teeth or dentures and rinsing your mouth with a mixture of  teaspoon of salt in 8 ounces of water or  teaspoon of sodium bicarbonate (baking soda) in 8 ounces of water. This should be done at least after every meal and at bedtime. . If you have mouth sores, avoid mouthwash that contains alcohol. Also avoid alcohol and smoking because they can bother your mouth and throat. . If you get a rash do not put anything on it unless your doctor or nurse says you may. Keep the area around the rash clean and dry. Let your doctor know right away if you get a rash while on this medicine. . Ask your doctor or nurse about medicine that is available to help stop or lessen nausea or throwing up. . Be careful when cooking, walking, and handling sharp objects and hot liquids.  Food and Drug Interactions There are no known interactions of etoposide with food.  This drug may interact with other medication. Tell your doctor and pharmacist about all the medication and dietary supplements (vitamins, minerals, herbs and others) that you are taking at this time. The safety and effectiveness of dietary supplements and alternative diets are often unknown. Using these might affect your cancer or interfere with your treatment. Until more is known, you should not use dietary supplements or alternative diets without your cancer doctor's help.  Important Information . Take etoposide pills one hour before eating or two to three hours after eating. . Store this drug in the refrigerator. . Missed dose: If you miss a dose, take it as soon as you remember, unless it is close to the time of your next dose. If it is close to your next dose, just take the next dose at your normal time. Do not take 2 doses at once. Do not take extra doses.  When to Call the Doctor Call your doctor or nurse right away if you have any of these symptoms: . Trouble breathing or feeling short of breath . Fever of 100.5 F (38 C) or higher . Chills . Easy bleeding or bruising . Rash or itching . Feeling dizzy or lightheaded . Feeling that your heart is beating in a fast or not normal way (palpitations) . Loose bowel movements (diarrhea) more than 4 times a day or diarrhea with weakness or feeling lightheaded . Feeling confused . Nausea that stops you from eating or drinking . Throwing up more than 3 times a day Call your doctor or nurse as soon as possible if you have any of these symptoms: . Numbness, tingling, decreased feeling or weakness in fingers, toes, arms, or legs . Trouble walking or changes in the way you walk . Pain in your mouth or throat that makes it hard to eat or drink  Sexual Problems and Reproductive Concerns . Infertility warning: Sexual problems and reproduction concerns may happen. In  both men and women, this drug may affect your ability to have children. This cannot be  determined before your treatment. Talk with your doctor or nurse if you plan to have children. Ask for information on sperm or egg banking. . In men, this drug may interfere with your ability to make sperm, but it should not change your ability to have sexual relations. . In women, menstrual bleeding may become irregular or stop while you are getting this drug. Do not assume that you cannot become pregnant if you do not have a menstrual period. . Women may go through signs of menopause (change of life) like vaginal dryness or itching. Vaginal lubricants can be used to lessen vaginal dryness, itching, and pain during sexual relations. . Genetic counseling is available for you to talk about the effects of this drug therapy on future pregnancies. Also, a genetic counselor can look at the possible risk of problems in the unborn baby due to this medicine if an exposure happens during pregnancy. . Pregnancy warning: This drug may have harmful effects on the unborn child, so effective methods of birth control should be used during your cancer treatment. Ask your doctor or nurse about effective methods of birth control. . Breast feeding warning: It is not known if this drug passes into breast milk. For this reason, women should talk to their doctor about the risks and benefits of breast feeding during treatment with this drug because this drug may enter the breast milk and badly harm a breast feeding baby.    SELF CARE ACTIVITIES WHILE ON CHEMOTHERAPY: Hydration Increase your fluid intake 48 hours prior to treatment and drink at least 8 to 12 cups (64 ounces) of water/decaff beverages per day after treatment. You can still have your cup of coffee or soda but these beverages do not count as part of your 8 to 12 cups that you need to drink daily. No alcohol intake.  Medications Continue taking your normal prescription medication as prescribed.  If you start any new herbal or new supplements please let us know  first to make sure it is safe.  Mouth Care Have teeth cleaned professionally before starting treatment. Keep dentures and partial plates clean. Use soft toothbrush and do not use mouthwashes that contain alcohol. Biotene is a good mouthwash that is available at most pharmacies or may be ordered by calling [phone]. Use warm salt water gargles (1 teaspoon salt per 1 quart warm water) before and after meals and at bedtime. Or you may rinse with 2 tablespoons of three-percent hydrogen peroxide mixed in eight ounces of water. If you are still having problems with your mouth or sores in your mouth please call the clinic. If you need dental work, please let the doctor know before you go for your appointment so that we can coordinate the best possible time for you in regards to your chemo regimen. You need to also let your dentist know that you are actively taking chemo. We may need to do labs prior to your dental appointment.   Skin Care Always use sunscreen that has not expired and with SPF (Sun Protection Factor) of 50 or higher. Wear hats to protect your head from the sun. Remember to use sunscreen on your hands, ears, face, & feet.  Use good moisturizing lotions such as udder cream, eucerin, or even Vaseline. Some chemotherapies can cause dry skin, color changes in your skin and nails.    . Avoid long, hot showers or baths. Marland Kitchen  Use gentle, fragrance-free soaps and laundry detergent. . Use moisturizers, preferably creams or ointments rather than lotions because the thicker consistency is better at preventing skin dehydration. Apply the cream or ointment within 15 minutes of showering. Reapply moisturizer at night, and moisturize your hands every time after you wash them.  Hair Loss (if your doctor says your hair will fall out)  . If your doctor says that your hair is likely to fall out, decide before you begin chemo whether you want to wear a wig. You may want to shop before treatment to match your  hair color. . Hats, turbans, and scarves can also camouflage hair loss, although some people prefer to leave their heads uncovered. If you go bare-headed outdoors, be sure to use sunscreen on your scalp. . Cut your hair short. It eases the inconvenience of shedding lots of hair, but it also can reduce the emotional impact of watching your hair fall out. . Don't perm or color your hair during chemotherapy. Those chemical treatments are already damaging to hair and can enhance hair loss. Once your chemo treatments are done and your hair has grown back, it's OK to resume dyeing or perming hair. With chemotherapy, hair loss is almost always temporary. But when it grows back, it may be a different color or texture. In older adults who still had hair color before chemotherapy, the new growth may be completely gray.  Often, new hair is very fine and soft.  Infection Prevention Please wash your hands for at least 30 seconds using warm soapy water. Handwashing is the #1 way to prevent the spread of germs. Stay away from sick people or people who are getting over a cold. If you develop respiratory systems such as green/yellow mucus production or productive cough or persistent cough let us know and we will see if you need an antibiotic. It is a good idea to keep a pair of gloves on when going into grocery stores/[employer] to decrease your risk of coming into contact with germs on the carts, etc. Carry alcohol hand gel with you at all times and use it frequently if out in public. If your temperature reaches 100.5 or higher please call the clinic and let us know.  If it is after hours or on the weekend please go to the ER if your temperature is over 100.5.  Please have your own personal thermometer at home to use.    Sex and bodily fluids If you are going to have sex, a condom must be used to protect the person that isn't taking chemotherapy. Chemo can decrease your libido (sex drive). For a few days after chemotherapy,  chemotherapy can be excreted through your bodily fluids.  When using the toilet please close the lid and flush the toilet twice.  Do this for a few day after you have had chemotherapy.   Effects of chemotherapy on your sex life Some changes are simple and won't last long. They won't affect your sex life permanently. Sometimes you may feel: . too tired . not strong enough to be very active . sick or sore  . not in the mood . anxious or low Your anxiety might not seem related to sex. For example, you may be worried about the cancer and how your treatment is going. Or you may be worried about money, or about how you family are coping with your illness. These things can cause stress, which can affect your interest in sex. It's important to talk to your  partner about how you feel. Remember - the changes to your sex life don't usually last long. There's usually no medical reason to stop having sex during chemo. The drugs won't have any long term physical effects on your performance or enjoyment of sex. Cancer can't be passed on to your partner during sex  Contraception It's important to use reliable contraception during treatment. Avoid getting pregnant while you or your partner are having chemotherapy. This is because the drugs may harm the baby. Sometimes chemotherapy drugs can leave a man or woman infertile.  This means you would not be able to have children in the future. You might want to talk to someone about permanent infertility. It can be very difficult to learn that you may no longer be able to have children. Some people find counselling helpful. There might be ways to preserve your fertility, although this is easier for men than for women. You may want to speak to a fertility expert. You can talk about sperm banking or harvesting your eggs. You can also ask about other fertility options, such as donor eggs. If you have or have had breast cancer, your doctor might advise you not to take the  contraceptive pill. This is because the hormones in it might affect the cancer.  It is not known for sure whether or not chemotherapy drugs can be passed on through semen or secretions from the vagina. Because of this some doctors advise people to use a barrier method if you have sex during treatment. This applies to vaginal, anal or oral sex. Generally, doctors advise a barrier method only for the time you are actually having the treatment and for about a week after your treatment. Advice like this can be worrying, but this does not mean that you have to avoid being intimate with your partner. You can still have close contact with your partner and continue to enjoy sex.  Animals If you have cats or birds we just ask that you not change the litter or change the cage.  Please have someone else do this for you while you are on chemotherapy.   Food Safety During and After Cancer Treatment Food safety is important for people both during and after cancer treatment. Cancer and cancer treatments, such as chemotherapy, radiation therapy, and stem cell/bone marrow transplantation, often weaken the immune system. This makes it harder for your body to protect itself from foodborne illness, also called food poisoning. Foodborne illness is caused by eating food that contains harmful bacteria, parasites, or viruses.  Foods to avoid Some foods have a higher risk of becoming tainted with bacteria. These include: Marland Kitchen Unwashed fresh fruit and vegetables, especially leafy vegetables that can hide dirt and other contaminants . Raw sprouts, such as alfalfa sprouts . Raw or undercooked beef, especially ground beef, or other raw or undercooked meat and poultry . Fatty, fried, or spicy foods immediately before or after treatment.  These can sit heavy on your stomach and make you feel nauseous. . Raw or undercooked shellfish, such as oysters. . Sushi and sashimi, which often contain raw fish.  . Unpasteurized beverages,  such as unpasteurized fruit juices, raw milk, raw yogurt, or cider . Undercooked eggs, such as soft boiled, over easy, and poached; raw, unpasteurized eggs; or foods made with raw egg, such as homemade raw cookie dough and homemade mayonnaise Simple steps for food safety Shop smart. . Do not buy food stored or displayed in an unclean area. . Do not buy bruised or  damaged fruits or vegetables. . Do not buy cans that have cracks, dents, or bulges. . Pick up foods that can spoil at the end of your shopping trip and store them in a cooler on the way home. Prepare and clean up foods carefully. . Rinse all fresh fruits and vegetables under running water, and dry them with a clean towel or paper towel. . Clean the top of cans before opening them. . After preparing food, wash your hands for 20 seconds with hot water and soap. Pay special attention to areas between fingers and under nails. . Clean your utensils and dishes with hot water and soap. Marland Kitchen Disinfect your kitchen and cutting boards using 1 teaspoon of liquid, unscented bleach mixed into 1 quart of water.   Dispose of old food. . Eat canned and packaged food before its expiration date (the "use by" or "best before" date). . Consume refrigerated leftovers within 3 to 4 days. After that time, throw out the food. Even if the food does not smell or look spoiled, it still may be unsafe. Some bacteria, such as Listeria, can grow even on foods stored in the refrigerator if they are kept for too long. Take precautions when eating out. . At restaurants, avoid buffets and salad bars where food sits out for a long time and comes in contact with many people. Food can become contaminated when someone with a virus, often a norovirus, or another "bug" handles it. . Put any leftover food in a "to-go" container yourself, rather than having the server do it. And, refrigerate leftovers as soon as you get home. . Choose restaurants that are clean and that are willing  to prepare your food as you order it cooked.   MEDICATIONS:                                                                                                                                                              Zofran/Ondansetron '8mg'$  tablet. Take 1 tablet every 8 hours as needed for nausea/vomiting. (#1 nausea med to take, this can constipate)  Compazine/Prochlorperazine '10mg'$  tablet. Take 1 tablet every 6 hours as needed for nausea/vomiting. (#2 nausea med to take, this can make you sleepy)   EMLA cream. Apply a quarter size amount to port site 1 hour prior to chemo. Do not rub in. Cover with plastic wrap.   Over-the-Counter Meds:  Miralax 1 capful in 8 oz of fluid daily. May increase to two times a day if needed. This is a stool softener. If this doesn't work proceed you can add:  Senokot S-start with 1 tablet two times a day and increase to 4 tablets two times a day if needed. (total of 8 tablets in a 24 hour period). This is a stimulant laxative.   Call  us if this does not help your bowels move.   Imodium '2mg'$  capsule. Take 2 capsules after the 1st loose stool and then 1 capsule every 2 hours until you go a total of 12 hours without having a loose stool. Call the [facility] if loose stools continue. If diarrhea occurs @ bedtime, take 2 capsules @ bedtime. Then take 2 capsules every 4 hours until morning. Call [facility].     Constipation Sheet *Miralax in 8 oz of fluid daily.  May increase to two times a day if needed.  This is a stool softener.  If this not enough to keep your bowel regular:  You can add:  *Senokot S, start with one tablet twice a day and can increase to 4 tablets twice a day if needed.  This is a stimulant laxative.   Sometimes when you take pain medication you need BOTH a medicine to keep your stool soft and a medicine to help your bowel push it out!  Please call if the above does not work for you.   Do not go more than 2 days without a bowel  movement.  It is very important that you do not become constipated.  It will make you feel sick to your stomach (nausea) and can cause abdominal pain and vomiting.     Diarrhea Sheet  If you are having loose stools/diarrhea, please purchase Imodium and begin taking as outlined:  At the first sign of poorly formed or loose stools you should begin taking Imodium(loperamide) 2 mg capsules.  Take two caplets ('4mg'$ ) followed by one caplet ('2mg'$ ) every 2 hours until you have had no diarrhea for 12 hours.  During the night take two caplets ('4mg'$ ) at bedtime and continue every 4 hours during the night until the morning.  Stop taking Imodium only after there is no sign of diarrhea for 12 hours.    Always call the [facility] if you are having loose stools/diarrhea that you can't get under control.  Loose stools/disrrhea leads to dehydration (loss of water) in your body.  We have other options of trying to get the loose stools/diarrhea to stopped but you must let us know!     Nausea Sheet  Zofran/Ondansetron '8mg'$  tablet. Take 1 tablet every 8 hours as needed for nausea/vomiting. (#1 nausea med to take, this can constipate)  Compazine/Prochlorperazine '10mg'$  tablet. Take 1 tablet every 6 hours as needed for nausea/vomiting. (#2 nausea med to take, this can make you sleepy)  You can take these medications together or separately.  We would first like for you to try the Ondansetron by itself and then take the Prochloperizine if needed. But you are allowed to take both medications at the same time if your nausea is that severe.  If you are having persistent nausea (nausea that does not stop) please take these medications on a staggered schedule so that the nausea medication stays in your body.  Please call the [facility] and let us know the amount of nausea that you are experiencing.  If you begin to vomit, you need to call the [facility] and if it is the weekend and you have vomited more than one time and cant  get it to stop-go to the Emergency Room.  Persistent nausea/vomiting can lead to dehydration (loss of fluid in your body) and will make you feel terrible.   Ice chips, sips of clear liquids, foods that are @ room temperature, crackers, and toast tend to be better tolerated.  SYMPTOMS TO REPORT AS SOON AS POSSIBLE AFTER TREATMENT:  FEVER GREATER THAN 100.5 F  CHILLS WITH OR WITHOUT FEVER  NAUSEA AND VOMITING THAT IS NOT CONTROLLED WITH YOUR NAUSEA MEDICATION  UNUSUAL SHORTNESS OF BREATH  UNUSUAL BRUISING OR BLEEDING  TENDERNESS IN MOUTH AND THROAT WITH OR WITHOUT PRESENCE OF ULCERS  URINARY PROBLEMS  BOWEL PROBLEMS  UNUSUAL RASH    Wear comfortable clothing and clothing appropriate for easy access to any Portacath or PICC line. Let us know if there is anything that we can do to make your therapy better!    What to do if you need assistance after hours or on the weekends: CALL [phone].  HOLD on the line, do not hang up.  You will hear multiple messages but at the end you will be connected with a nurse triage line.  They will contact the doctor if necessary.  Most of the time they will be able to assist you.  Do not call the hospital operator.     I have been informed and understand all of the instructions given to me and have received a copy. I have been instructed to call the clinic [phone] or my family physician as soon as possible for continued medical care, if indicated. I do not have any more questions at this time but understand that I may call the [facility] or the Patient Navigator at [phone] during office hours should I have questions or need assistance in obtaining follow-up care.

## 2016-08-22 NOTE — Progress Notes (Signed)
Per Amy Nance's phone conversation with patient, Holly Knight has elected to have her radiation therapy in [place], [place].   Records are being faxed per Amy.    Mike Craze, NP [facility] [phone]

## 2016-08-22 NOTE — Telephone Encounter (Signed)
Chemotherapy education completed.  appts made.  meds escribed.    Called pt and let her know about the medication that I had sent into the pharmacy for her.

## 2016-08-22 NOTE — Progress Notes (Unsigned)
Referral to Rad onc Angelina Sheriff, referral [facility] port. Records faxed on 2/23

## 2016-08-25 ENCOUNTER — Encounter (HOSPITAL_COMMUNITY): Payer: Federal, State, Local not specified - PPO | Attending: Oncology

## 2016-08-25 ENCOUNTER — Encounter (HOSPITAL_COMMUNITY): Payer: Federal, State, Local not specified - PPO

## 2016-08-25 ENCOUNTER — Other Ambulatory Visit (HOSPITAL_COMMUNITY): Payer: Self-pay | Admitting: Oncology

## 2016-08-25 VITALS — BP 103/65 | HR 86 | Temp 98.5°F | Resp 18 | Wt 129.4 lb

## 2016-08-25 DIAGNOSIS — C3491 Malignant neoplasm of unspecified part of right bronchus or lung: Secondary | ICD-10-CM

## 2016-08-25 DIAGNOSIS — C3432 Malignant neoplasm of lower lobe, left bronchus or lung: Secondary | ICD-10-CM

## 2016-08-25 DIAGNOSIS — Z5111 Encounter for antineoplastic chemotherapy: Secondary | ICD-10-CM

## 2016-08-25 MED ORDER — SODIUM CHLORIDE 0.9 % IV SOLN
Freq: Once | INTRAVENOUS | Status: AC
  Administered 2016-08-25: 11:00:00 via INTRAVENOUS

## 2016-08-25 MED ORDER — PALONOSETRON HCL INJECTION 0.25 MG/5ML
0.2500 mg | Freq: Once | INTRAVENOUS | Status: AC
  Administered 2016-08-25: 0.25 mg via INTRAVENOUS
  Filled 2016-08-25: qty 5

## 2016-08-25 MED ORDER — SODIUM CHLORIDE 0.9 % IV SOLN
Freq: Once | INTRAVENOUS | Status: AC
  Administered 2016-08-25: 11:00:00 via INTRAVENOUS
  Filled 2016-08-25: qty 5

## 2016-08-25 MED ORDER — PALONOSETRON HCL INJECTION 0.25 MG/5ML
INTRAVENOUS | Status: AC
  Filled 2016-08-25: qty 5

## 2016-08-25 MED ORDER — SODIUM CHLORIDE 0.9 % IV SOLN
120.0000 mg/m2 | Freq: Once | INTRAVENOUS | Status: AC
  Administered 2016-08-25: 200 mg via INTRAVENOUS
  Filled 2016-08-25: qty 10

## 2016-08-25 MED ORDER — HEPARIN SOD (PORK) LOCK FLUSH 100 UNIT/ML IV SOLN
250.0000 [IU] | Freq: Once | INTRAVENOUS | Status: AC | PRN
  Administered 2016-08-25: 250 [IU]
  Filled 2016-08-25: qty 5

## 2016-08-25 NOTE — Patient Instructions (Signed)
[facility] Discharge Instructions for Patients Receiving Chemotherapy   Beginning January 23rd 2017 lab work for the [facility] will be done in the  Main lab at [facility] on 1st floor. If you have a lab appointment with the [facility] please come in thru the  Main Entrance and check in at the main information desk   Today you received the following chemotherapy agents etoposide.  To help prevent nausea and vomiting after your treatment, we encourage you to take your nausea medication as instructed.  If you develop nausea and vomiting, or diarrhea that is not controlled by your medication, call the clinic.  The clinic phone number is [phone]. Office hours are Monday-Friday 8:30am-5:00pm.  BELOW ARE SYMPTOMS THAT SHOULD BE REPORTED IMMEDIATELY:  *FEVER GREATER THAN 101.0 F  *CHILLS WITH OR WITHOUT FEVER  NAUSEA AND VOMITING THAT IS NOT CONTROLLED WITH YOUR NAUSEA MEDICATION  *UNUSUAL SHORTNESS OF BREATH  *UNUSUAL BRUISING OR BLEEDING  TENDERNESS IN MOUTH AND THROAT WITH OR WITHOUT PRESENCE OF ULCERS  *URINARY PROBLEMS  *BOWEL PROBLEMS  UNUSUAL RASH Items with * indicate a potential emergency and should be followed up as soon as possible. If you have an emergency after office hours please contact your primary care physician or go to the nearest emergency department.  Please call the clinic during office hours if you have any questions or concerns.   You may also contact the Patient Navigator at [phone] should you have any questions or need assistance in obtaining follow up care.      Resources For Cancer Patients and their Caregivers ? American Cancer Society: Can assist with transportation, wigs, general needs, runs Look Good Feel Better.        317-788-2171 ? Cancer Care: Provides financial assistance, online support groups, medication/co-pay assistance.  1-800-813-HOPE 989-150-8776) ? [facility] Assists  [place] cancer patients and their families through emotional , educational and financial support.  807-875-5051 ? [place] DSS Where to apply for food stamps, Medicaid and utility assistance. 703-307-7377 ? RCATS: Transportation to medical appointments. 534-853-6788 ? Social Security Administration: May apply for disability if have a Stage IV cancer. [phone] ? [employer], Disability and Transit Services: Assists with nutrition, care and transit needs. [phone]

## 2016-08-25 NOTE — Progress Notes (Signed)
Chemotherapy teaching complete on VP-16 and cisplatin.  Consent obtained and extensive teachign packet given.

## 2016-08-25 NOTE — Progress Notes (Signed)
Ok per T.Kefalas,PA to use labs from 08/21/16 for chemo today.   Tolerated chemo well. Stable on discharge home to self via wheelchair.

## 2016-08-26 ENCOUNTER — Ambulatory Visit (HOSPITAL_COMMUNITY): Payer: Federal, State, Local not specified - PPO

## 2016-08-26 ENCOUNTER — Encounter (HOSPITAL_COMMUNITY): Payer: Federal, State, Local not specified - PPO

## 2016-08-26 ENCOUNTER — Encounter (HOSPITAL_BASED_OUTPATIENT_CLINIC_OR_DEPARTMENT_OTHER): Payer: Federal, State, Local not specified - PPO

## 2016-08-26 VITALS — BP 93/56 | HR 86 | Temp 98.3°F | Resp 16

## 2016-08-26 DIAGNOSIS — C3491 Malignant neoplasm of unspecified part of right bronchus or lung: Secondary | ICD-10-CM

## 2016-08-26 DIAGNOSIS — C3432 Malignant neoplasm of lower lobe, left bronchus or lung: Secondary | ICD-10-CM | POA: Diagnosis not present

## 2016-08-26 DIAGNOSIS — Z5111 Encounter for antineoplastic chemotherapy: Secondary | ICD-10-CM

## 2016-08-26 MED ORDER — SODIUM CHLORIDE 0.9 % IV SOLN
Freq: Once | INTRAVENOUS | Status: AC
  Administered 2016-08-26: 11:00:00 via INTRAVENOUS

## 2016-08-26 MED ORDER — POTASSIUM CHLORIDE 2 MEQ/ML IV SOLN
Freq: Once | INTRAVENOUS | Status: AC
  Administered 2016-08-26: 09:00:00 via INTRAVENOUS
  Filled 2016-08-26: qty 10

## 2016-08-26 MED ORDER — HEPARIN SOD (PORK) LOCK FLUSH 100 UNIT/ML IV SOLN
250.0000 [IU] | Freq: Once | INTRAVENOUS | Status: AC | PRN
  Administered 2016-08-26: 250 [IU]
  Filled 2016-08-26: qty 5

## 2016-08-26 MED ORDER — DEXAMETHASONE SODIUM PHOSPHATE 10 MG/ML IJ SOLN
10.0000 mg | Freq: Once | INTRAMUSCULAR | Status: AC
  Administered 2016-08-26: 10 mg via INTRAVENOUS
  Filled 2016-08-26: qty 1

## 2016-08-26 MED ORDER — SODIUM CHLORIDE 0.9 % IV SOLN
10.0000 mg | Freq: Once | INTRAVENOUS | Status: DC
  Filled 2016-08-26: qty 1

## 2016-08-26 MED ORDER — SODIUM CHLORIDE 0.9 % IV SOLN
60.0000 mg/m2 | Freq: Once | INTRAVENOUS | Status: AC
  Administered 2016-08-26: 100 mg via INTRAVENOUS
  Filled 2016-08-26: qty 100

## 2016-08-26 MED ORDER — LORAZEPAM 2 MG/ML IJ SOLN
0.5000 mg | Freq: Once | INTRAMUSCULAR | Status: AC
  Administered 2016-08-26: 0.5 mg via INTRAVENOUS

## 2016-08-26 MED ORDER — ETOPOSIDE CHEMO INJECTION 1 GM/50ML
120.0000 mg/m2 | Freq: Once | INTRAVENOUS | Status: AC
  Administered 2016-08-26: 200 mg via INTRAVENOUS
  Filled 2016-08-26: qty 10

## 2016-08-26 NOTE — Progress Notes (Signed)
Patients denies any complaints last night post first etoposide infusion.  Tolerated chemo well. Stable on discharge home to self via wheelchair.

## 2016-08-26 NOTE — Patient Instructions (Signed)
[facility] Discharge Instructions for Patients Receiving Chemotherapy   Beginning January 23rd 2017 lab work for the Cancer Center will be done in the  Main lab at [facility] on 1st floor. If you have a lab appointment with the Cancer Center please come in thru the  Main Entrance and check in at the main information desk   Today you received the following chemotherapy agents cisplatin and etoposide.  To help prevent nausea and vomiting after your treatment, we encourage you to take your nausea medication as instructed.  If you develop nausea and vomiting, or diarrhea that is not controlled by your medication, call the clinic.  The clinic phone number is [phone]. Office hours are Monday-Friday 8:30am-5:00pm.  BELOW ARE SYMPTOMS THAT SHOULD BE REPORTED IMMEDIATELY:  *FEVER GREATER THAN 101.0 F  *CHILLS WITH OR WITHOUT FEVER  NAUSEA AND VOMITING THAT IS NOT CONTROLLED WITH YOUR NAUSEA MEDICATION  *UNUSUAL SHORTNESS OF BREATH  *UNUSUAL BRUISING OR BLEEDING  TENDERNESS IN MOUTH AND THROAT WITH OR WITHOUT PRESENCE OF ULCERS  *URINARY PROBLEMS  *BOWEL PROBLEMS  UNUSUAL RASH Items with * indicate a potential emergency and should be followed up as soon as possible. If you have an emergency after office hours please contact your primary care physician or go to the nearest emergency department.  Please call the clinic during office hours if you have any questions or concerns.   You may also contact the Patient Navigator at [phone] should you have any questions or need assistance in obtaining follow up care.      Resources For Cancer Patients and their Caregivers ? American Cancer Society: Can assist with transportation, wigs, general needs, runs Look Good Feel Better.        1-888-227-6333 ? Cancer Care: Provides financial assistance, online support groups, medication/co-pay assistance.  1-800-813-HOPE (4673) ? [facility] Assists [place] cancer patients and their families through emotional , educational and financial support.  336-427-4357 ? [place] DSS Where to apply for food stamps, Medicaid and utility assistance. 336-342-1394 ? RCATS: Transportation to medical appointments. 336-347-2287 ? Social Security Administration: May apply for disability if have a Stage IV cancer. 336-342-7796 1-800-772-1213 ? [place] Aging, Disability and Transit Services: Assists with nutrition, care and transit needs. [phone]         

## 2016-08-27 ENCOUNTER — Encounter (HOSPITAL_COMMUNITY): Payer: Self-pay

## 2016-08-27 ENCOUNTER — Encounter (HOSPITAL_BASED_OUTPATIENT_CLINIC_OR_DEPARTMENT_OTHER): Payer: Federal, State, Local not specified - PPO

## 2016-08-27 ENCOUNTER — Ambulatory Visit (HOSPITAL_COMMUNITY): Payer: Federal, State, Local not specified - PPO

## 2016-08-27 VITALS — BP 111/71 | HR 80 | Temp 98.7°F | Resp 18

## 2016-08-27 DIAGNOSIS — Z5111 Encounter for antineoplastic chemotherapy: Secondary | ICD-10-CM | POA: Diagnosis not present

## 2016-08-27 DIAGNOSIS — C3432 Malignant neoplasm of lower lobe, left bronchus or lung: Secondary | ICD-10-CM | POA: Diagnosis not present

## 2016-08-27 DIAGNOSIS — C3491 Malignant neoplasm of unspecified part of right bronchus or lung: Secondary | ICD-10-CM

## 2016-08-27 MED ORDER — DEXAMETHASONE SODIUM PHOSPHATE 10 MG/ML IJ SOLN
INTRAMUSCULAR | Status: AC
  Filled 2016-08-27: qty 1

## 2016-08-27 MED ORDER — SODIUM CHLORIDE 0.9 % IV SOLN: 10.0000 mg | Freq: Once | INTRAVENOUS | Status: DC

## 2016-08-27 MED ORDER — DEXAMETHASONE SODIUM PHOSPHATE 10 MG/ML IJ SOLN
10.0000 mg | Freq: Once | INTRAMUSCULAR | Status: AC
  Administered 2016-08-27: 10 mg via INTRAVENOUS

## 2016-08-27 MED ORDER — PEGFILGRASTIM 6 MG/0.6ML ~~LOC~~ PSKT
PREFILLED_SYRINGE | SUBCUTANEOUS | Status: AC
  Filled 2016-08-27: qty 0.6

## 2016-08-27 MED ORDER — SODIUM CHLORIDE 0.9 % IV SOLN
Freq: Once | INTRAVENOUS | Status: AC
  Administered 2016-08-27: 14:00:00 via INTRAVENOUS

## 2016-08-27 MED ORDER — PEGFILGRASTIM 6 MG/0.6ML ~~LOC~~ PSKT
6.0000 mg | PREFILLED_SYRINGE | Freq: Once | SUBCUTANEOUS | Status: AC
  Administered 2016-08-27: 6 mg via SUBCUTANEOUS

## 2016-08-27 MED ORDER — SODIUM CHLORIDE 0.9% FLUSH: 10.0000 mL | INTRAVENOUS | Status: DC | PRN

## 2016-08-27 MED ORDER — SODIUM CHLORIDE 0.9 % IV SOLN
120.0000 mg/m2 | Freq: Once | INTRAVENOUS | Status: AC
  Administered 2016-08-27: 200 mg via INTRAVENOUS
  Filled 2016-08-27: qty 10

## 2016-08-27 NOTE — Patient Instructions (Signed)
[facility] Discharge Instructions for Patients Receiving Chemotherapy   Beginning January 23rd 2017 lab work for the [facility] will be done in the  Main lab at [facility] on 1st floor. If you have a lab appointment with the [facility] please come in thru the  Main Entrance and check in at the main information desk   Today you received the following chemotherapy agent: Etoposide.  We also pulled your PICC line today.  PICC Removal, Care After Refer to this sheet in the next few weeks. These instructions provide you with information on caring for yourself after your procedure. Your health care provider may also give you more specific instructions. Your treatment has been planned according to current medical practices, but problems sometimes occur. Call your health care provider if you have any problems or questions after your procedure. What can I expect after the procedure? After your procedure, it is typical to have mild discomfort at the insertion site. This should not last for more than a day. Follow these instructions at home: You may remove the bandage after 24 hours. The PICC insertion site is very small. A small scab may develop over the insertion site. It is okay to wash the site gently with soap and water. Be careful not to remove or pick off the scab. Gently pat the site dry after washing it. You do not need to put another bandage over the insertion site. Do not lift anything heavy or do strenuous physical activity for 24 hours after the PICC is removed. This includes:  Weight lifting.  Strenuous yard work.  Any physical activity with repetitive arm movement. Contact a health care provider if:  You have swelling or puffiness in your arm at the PICC insertion site.  You have increasing tenderness at the PICC insertion site. Get help right away if:  You have numbness or tingling in your fingers, hand, or arm.  Your arm looks blue and feels cold.  You  have redness around the insertion site or a red streak goes up your arm.  You have any type of drainage from the PICC insertion site. This includes drainage such as:  Bleeding from the insertion site. If this happens, apply firm, direct pressure to the PICC insertion site with a clean towel.  Drainage that is yellow or tan.  You have a fever. This information is not intended to replace advice given to you by your health care provider. Make sure you discuss any questions you have with your health care provider. Document Released: 06/21/2013 Document Revised: 11/22/2015 Document Reviewed: 04/08/2013 Elsevier Interactive Patient Education  2017 [employer].    If you develop nausea and vomiting, or diarrhea that is not controlled by your medication, call the clinic.  The clinic phone number is [phone]. Office hours are Monday-Friday 8:30am-5:00pm.  BELOW ARE SYMPTOMS THAT SHOULD BE REPORTED IMMEDIATELY:  *FEVER GREATER THAN 101.0 F  *CHILLS WITH OR WITHOUT FEVER  NAUSEA AND VOMITING THAT IS NOT CONTROLLED WITH YOUR NAUSEA MEDICATION  *UNUSUAL SHORTNESS OF BREATH  *UNUSUAL BRUISING OR BLEEDING  TENDERNESS IN MOUTH AND THROAT WITH OR WITHOUT PRESENCE OF ULCERS  *URINARY PROBLEMS  *BOWEL PROBLEMS  UNUSUAL RASH Items with * indicate a potential emergency and should be followed up as soon as possible. If you have an emergency after office hours please contact your primary care physician or go to the nearest emergency department.  Please call the clinic during office hours if you have any questions  or concerns.   You may also contact the Patient Navigator at [phone] should you have any questions or need assistance in obtaining follow up care.      Resources For Cancer Patients and their Caregivers ? American Cancer Society: Can assist with transportation, wigs, general needs, runs Look Good Feel Better.        ([phone] ? Cancer Care: Provides  financial assistance, online support groups, medication/co-pay assistance.  1-800-813-HOPE 4325810510) ? [facility] Assists [place] cancer patients and their families through emotional , educational and financial support.  317-752-3531 ? [place] DSS Where to apply for food stamps, Medicaid and utility assistance. 631-364-4570 ? RCATS: Transportation to medical appointments. 8731134100 ? Social Security Administration: May apply for disability if have a Stage IV cancer. [phone] ? [employer], Disability and Transit Services: Assists with nutrition, care and transit needs. 8124585142

## 2016-08-27 NOTE — Progress Notes (Signed)
PICC line to RUE removed.  Total length 38cm, matched with insertion documentation.  Patient tolerated well.  Pressure dressing applied at the site. No active bleeding or discomfort at the end of the 30 minute assessment time.   Patient educated on care after as well as printed instructions given to the patient.  She verbalized understanding.  Patient tolerated chemotherapy infusion well.

## 2016-08-28 ENCOUNTER — Telehealth (HOSPITAL_COMMUNITY): Payer: Self-pay

## 2016-08-28 ENCOUNTER — Ambulatory Visit (INDEPENDENT_AMBULATORY_CARE_PROVIDER_SITE_OTHER): Payer: Federal, State, Local not specified - PPO | Admitting: General Surgery

## 2016-08-28 ENCOUNTER — Encounter: Payer: Self-pay | Admitting: General Surgery

## 2016-08-28 VITALS — BP 158/80 | HR 91 | Temp 98.9°F | Ht 67.0 in | Wt 131.0 lb

## 2016-08-28 DIAGNOSIS — C3490 Malignant neoplasm of unspecified part of unspecified bronchus or lung: Secondary | ICD-10-CM | POA: Diagnosis not present

## 2016-08-28 DIAGNOSIS — Z452 Encounter for adjustment and management of vascular access device: Secondary | ICD-10-CM

## 2016-08-28 NOTE — Telephone Encounter (Signed)
Patient called for 24 hour follow up, no answer.  Message left for her to call back.

## 2016-08-28 NOTE — Progress Notes (Signed)
Subjective:     Patient ID: Holly Knight, female   DOB: 11-02-1960, 56 y.o.   MRN: [id number]  HPI patient is a 56 year old black female who was referred by oncology for Port-A-Cath insertion. 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 Of surgical significance is the fact that she states she has a stent in the left subclavian artery. She does not complain of any pain. Past Medical History:  Diagnosis Date  . Anxiety   . BPPV (benign paroxysmal positional vertigo)    takes valium  . Cancer ([facility]) 01/2013  . Complication of anesthesia 02/10/2013   BP dropped too low and had a sroke  . COPD (chronic obstructive pulmonary disease) ([facility])   . Coronary artery disease   . H/O hiatal hernia   . Hypertension   . Lung cancer ([place])   . Peripheral vascular disease ([facility])    PAD-   . Pulmonary embolism ([facility]) 1985ish   due to birth control pills  . Small cell lung cancer ([facility]) 08/21/2016  . 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 Stroke [facility]) 2014   left hand weakness - PT     Past Surgical History:  Procedure Laterality Date  . BACK SURGERY    . CERVICAL SPINE SURGERY    . CORONARY ANGIOPLASTY  12/2011    stent  . CYST EXCISION Right 1989   breast  . SALIVARY STONE REMOVAL N/A 04/13/2013   Procedure: INTRAORAL EXCISION SUBMANDIBULAR STONES ;  Surgeon: Izora Gala, MD;  Location: [facility];  Service: ENT;  Laterality: N/A;  . TUMOR REMOVAL Right 02/10/2013   lung  . UTERINE FIBROID EMBOLIZATION  2005  . UTERINE FIBROID SURGERY     x2    Family History  Problem Relation Age of Onset  . Cancer Mother     breast  . Seizures Sister     Current Outpatient Prescriptions on File Prior to Visit  Medication Sig Dispense Refill  . amLODipine-benazepril (LOTREL) 5-20 MG per capsule Take 1 capsule by mouth 2 (two) times daily.    Marland Kitchen aspirin EC 81 MG tablet Take 81 mg by mouth daily.    Marland Kitchen atorvastatin (LIPITOR) 20 MG tablet Take 20 mg by mouth at bedtime.     Marland Kitchen azelastine (ASTELIN) 0.1 % nasal spray     . baclofen (LIORESAL) 10 MG tablet Take 10 mg by mouth every 4 (four) hours.    . budesonide-formoterol (SYMBICORT) 160-4.5 MCG/ACT inhaler 2 puff by inhalation twice daily as needed    . chlorpheniramine-HYDROcodone (TUSSIONEX PENNKINETIC ER) 10-8 MG/5ML SUER Take 5 mLs by mouth every 12 (twelve) hours as needed for cough. 140 mL 0  . Cholecalciferol (VITAMIN D3) 2000 units capsule Take by mouth.    . CISPLATIN IV Inject into the vein.    Marland Kitchen clopidogrel (PLAVIX) 75 MG tablet Take 75 mg by mouth daily.    . cyclobenzaprine (FLEXERIL) 10 MG tablet TAKE 1 TABLET THREE TIMES A DAY AS NEEDED FOR MUSCLE SPASMS    . dexamethasone (DECADRON) 4 MG tablet Take 2 tablets two times a day for 1 day on day 4 after cisplatin chemotherapy. Take with food. 30 tablet 1  . diazepam (VALIUM) 2 MG tablet Take 2 mg by mouth at bedtime as needed for anxiety.    . ETOPOSIDE IV Inject into the vein.    . fluticasone (FLONASE) 50 MCG/ACT nasal spray     . folic acid (FOLVITE) 1 MG tablet 1  mg    . furosemide (LASIX) 40 MG tablet Take 40 mg by mouth 2 (two) times daily.    . hydrALAZINE (APRESOLINE) 50 MG tablet Take 50 mg by mouth 2 (two) times daily.    Marland Kitchen ipratropium-albuterol (DUONEB) 0.5-2.5 (3) MG/3ML SOLN     . l-methylfolate-B6-B12 (METANX) 3-35-2 MG TABS tablet     . levofloxacin (LEVAQUIN) 500 MG tablet Take 1 tablet (500 mg total) by mouth daily. 5 tablet 0  . lidocaine-prilocaine (EMLA) cream Apply to affected area once 30 g 3  . LORazepam (ATIVAN) 0.5 MG tablet Take 1 tablet (0.5 mg total) by mouth every 6 (six) hours as needed (Nausea or vomiting). 30 tablet 0  . mirtazapine (REMERON) 15 MG tablet Take 15 mg by mouth at bedtime.    Marland Kitchen omeprazole (PRILOSEC) 20 MG capsule     . ondansetron (ZOFRAN) 8 MG tablet Take 1 tablet (8 mg total) by mouth 2 (two) times daily as needed. Start on the third day after cisplatin chemotherapy. 30 tablet 1  . Pegfilgrastim  (NEULASTA ONPRO [place]) Inject into the skin.    Marland Kitchen prochlorperazine (COMPAZINE) 10 MG tablet Take 1 tablet (10 mg total) by mouth every 6 (six) hours as needed (Nausea or vomiting). 30 tablet 1  . tiotropium (SPIRIVA HANDIHALER) 18 MCG inhalation capsule 18 mcg    . topiramate (TOPAMAX) 50 MG tablet Take 50 mg by mouth 2 (two) times daily.     No current facility-administered medications on file prior to visit.     Allergies  Allergen Reactions  . Penicillins Anaphylaxis  . Sulfa Antibiotics Rash    History  Alcohol Use No    Comment: 2- 3 a month    History  Smoking Status  . Former Smoker  . Years: 35.00  . Types: Cigarettes  . Quit date: 02/10/2013  Smokeless Tobacco  . Never Used    Vitals:   08/28/16 1104  BP: (!) 158/80  Pulse: 91  Temp: 98.9 F (37.2 C)    Review of Systems  Constitutional: Negative.   HENT: Positive for sinus pressure.   Eyes: Negative.   Respiratory: Positive for apnea and wheezing.   Cardiovascular: Negative.   Gastrointestinal: Negative.   Endocrine: Negative.   Genitourinary: Negative.   Musculoskeletal: Positive for back pain and gait problem.  Skin: Negative.   Allergic/Immunologic: Negative.   Neurological: Positive for dizziness and numbness.  Hematological: Negative.   Psychiatric/Behavioral: Negative.        Objective:   Physical Exam  Constitutional: She is oriented to person, place, and time. She appears well-developed and well-nourished. No distress.  Ambulates with a walker.  HENT:  Head: Normocephalic and atraumatic.  Neck: Normal range of motion. Neck supple.  Cardiovascular: Normal rate, regular rhythm and normal heart sounds.   Pulmonary/Chest: Effort normal and breath sounds normal. She has no wheezes. She has no rales.  Neurological: She is alert and oriented to person, place, and time.  Skin: Skin is warm and dry.  Vitals reviewed.  Oncology notes reviewed.    Assessment:     Small cell carcinoma, lung,  need for central venous access    Plan:     Scheduled for Port-A-Cath insertion on 09/08/2016. The risks and benefits of the procedure including bleeding, infection, and pneumothorax were fully explained to the patient, who gave informed consent.

## 2016-08-28 NOTE — H&P (Signed)
Subjective:     Patient ID: Holly Knight, female   DOB: Feb 22, 1961, 56 y.o.   MRN: [id number]  HPI patient is a 56 year old black female who was referred by oncology for Port-A-Cath insertion. 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 Of surgical significance is the fact that she states she has a stent in the left subclavian artery. She does not complain of any pain.     Past Medical History:  Diagnosis Date  . Anxiety   . BPPV (benign paroxysmal positional vertigo)    takes valium  . Cancer ([place]) 01/2013  . Complication of anesthesia 02/10/2013   BP dropped too low and had a sroke  . COPD (chronic obstructive pulmonary disease) ([place])   . Coronary artery disease   . H/O hiatal hernia   . Hypertension   . Lung cancer ([place])   . Peripheral vascular disease ([facility])    PAD-   . Pulmonary embolism ([place]) 1985ish   due to birth control pills  . Small cell lung cancer ([place]) 08/21/2016  . 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 Stroke [facility]) 2014   left hand weakness - PT          Past Surgical History:  Procedure Laterality Date  . BACK SURGERY    . CERVICAL SPINE SURGERY    . CORONARY ANGIOPLASTY  12/2011    stent  . CYST EXCISION Right 1989   breast  . SALIVARY STONE REMOVAL N/A 04/13/2013   Procedure: INTRAORAL EXCISION SUBMANDIBULAR STONES ;  Surgeon: Izora Gala, MD;  Location: [facility];  Service: ENT;  Laterality: N/A;  . TUMOR REMOVAL Right 02/10/2013   lung  . UTERINE FIBROID EMBOLIZATION  2005  . UTERINE FIBROID SURGERY     x2          Family History  Problem Relation Age of Onset  . Cancer Mother     breast  . Seizures Sister           Current Outpatient Prescriptions on File Prior to Visit  Medication Sig Dispense Refill  . amLODipine-benazepril (LOTREL) 5-20 MG per capsule Take 1 capsule by mouth 2 (two) times daily.    Marland Kitchen aspirin EC 81 MG tablet Take 81 mg by mouth daily.    Marland Kitchen  atorvastatin (LIPITOR) 20 MG tablet Take 20 mg by mouth at bedtime.    Marland Kitchen azelastine (ASTELIN) 0.1 % nasal spray     . baclofen (LIORESAL) 10 MG tablet Take 10 mg by mouth every 4 (four) hours.    . budesonide-formoterol (SYMBICORT) 160-4.5 MCG/ACT inhaler 2 puff by inhalation twice daily as needed    . chlorpheniramine-HYDROcodone (TUSSIONEX PENNKINETIC ER) 10-8 MG/5ML SUER Take 5 mLs by mouth every 12 (twelve) hours as needed for cough. 140 mL 0  . Cholecalciferol (VITAMIN D3) 2000 units capsule Take by mouth.    . CISPLATIN IV Inject into the vein.    Marland Kitchen clopidogrel (PLAVIX) 75 MG tablet Take 75 mg by mouth daily.    . cyclobenzaprine (FLEXERIL) 10 MG tablet TAKE 1 TABLET THREE TIMES A DAY AS NEEDED FOR MUSCLE SPASMS    . dexamethasone (DECADRON) 4 MG tablet Take 2 tablets two times a day for 1 day on day 4 after cisplatin chemotherapy. Take with food. 30 tablet 1  . diazepam (VALIUM) 2 MG tablet Take 2 mg by mouth at bedtime as needed for anxiety.    . ETOPOSIDE IV Inject into the vein.    Marland Kitchen  fluticasone (FLONASE) 50 MCG/ACT nasal spray     . folic acid (FOLVITE) 1 MG tablet 1 mg    . furosemide (LASIX) 40 MG tablet Take 40 mg by mouth 2 (two) times daily.    . hydrALAZINE (APRESOLINE) 50 MG tablet Take 50 mg by mouth 2 (two) times daily.    Marland Kitchen ipratropium-albuterol (DUONEB) 0.5-2.5 (3) MG/3ML SOLN     . l-methylfolate-B6-B12 (METANX) 3-35-2 MG TABS tablet     . levofloxacin (LEVAQUIN) 500 MG tablet Take 1 tablet (500 mg total) by mouth daily. 5 tablet 0  . lidocaine-prilocaine (EMLA) cream Apply to affected area once 30 g 3  . LORazepam (ATIVAN) 0.5 MG tablet Take 1 tablet (0.5 mg total) by mouth every 6 (six) hours as needed (Nausea or vomiting). 30 tablet 0  . mirtazapine (REMERON) 15 MG tablet Take 15 mg by mouth at bedtime.    Marland Kitchen omeprazole (PRILOSEC) 20 MG capsule     . ondansetron (ZOFRAN) 8 MG tablet Take 1 tablet (8 mg total) by mouth 2 (two)  times daily as needed. Start on the third day after cisplatin chemotherapy. 30 tablet 1  . Pegfilgrastim (NEULASTA ONPRO [place]) Inject into the skin.    Marland Kitchen prochlorperazine (COMPAZINE) 10 MG tablet Take 1 tablet (10 mg total) by mouth every 6 (six) hours as needed (Nausea or vomiting). 30 tablet 1  . tiotropium (SPIRIVA HANDIHALER) 18 MCG inhalation capsule 18 mcg    . topiramate (TOPAMAX) 50 MG tablet Take 50 mg by mouth 2 (two) times daily.     No current facility-administered medications on file prior to visit.         Allergies  Allergen Reactions  . Penicillins Anaphylaxis  . Sulfa Antibiotics Rash         History  Alcohol Use No    Comment: 2- 3 a month         History  Smoking Status  . Former Smoker  . Years: 35.00  . Types: Cigarettes  . Quit date: 02/10/2013  Smokeless Tobacco  . Never Used       Vitals:   08/28/16 1104  BP: (!) 158/80  Pulse: 91  Temp: 98.9 F (37.2 C)    Review of Systems  Constitutional: Negative.   HENT: Positive for sinus pressure.   Eyes: Negative.   Respiratory: Positive for apnea and wheezing.   Cardiovascular: Negative.   Gastrointestinal: Negative.   Endocrine: Negative.   Genitourinary: Negative.   Musculoskeletal: Positive for back pain and gait problem.  Skin: Negative.   Allergic/Immunologic: Negative.   Neurological: Positive for dizziness and numbness.  Hematological: Negative.   Psychiatric/Behavioral: Negative.        Objective:   Physical Exam  Constitutional: She is oriented to person, place, and time. She appears well-developed and well-nourished. No distress.  Ambulates with a walker.  HENT:  Head: Normocephalic and atraumatic.  Neck: Normal range of motion. Neck supple.  Cardiovascular: Normal rate, regular rhythm and normal heart sounds.   Pulmonary/Chest: Effort normal and breath sounds normal. She has no wheezes. She has no rales.  Neurological: She is alert and oriented to  person, place, and time.  Skin: Skin is warm and dry.  Vitals reviewed.  Oncology notes reviewed.    Assessment:     Small cell carcinoma, lung, need for central venous access    Plan:     Scheduled for Port-A-Cath insertion on 09/08/2016. The risks and benefits of the procedure including bleeding, infection,  and pneumothorax were fully explained to the patient, who gave informed consent.    Patient is off her Plavix.

## 2016-08-28 NOTE — Patient Instructions (Signed)
Implanted Port Home Guide An implanted port is a type of central line that is placed under the skin. Central lines are used to provide IV access when treatment or nutrition needs to be given through a person's veins. Implanted ports are used for long-term IV access. An implanted port may be placed because:  You need IV medicine that would be irritating to the small veins in your hands or arms.  You need long-term IV medicines, such as antibiotics.  You need IV nutrition for a long period.  You need frequent blood draws for lab tests.  You need dialysis.  Implanted ports are usually placed in the chest area, but they can also be placed in the upper arm, the abdomen, or the leg. An implanted port has two main parts:  Reservoir. The reservoir is round and will appear as a small, raised area under your skin. The reservoir is the part where a needle is inserted to give medicines or draw blood.  Catheter. The catheter is a thin, flexible tube that extends from the reservoir. The catheter is placed into a large vein. Medicine that is inserted into the reservoir goes into the catheter and then into the vein.  How will I care for my incision site? Do not get the incision site wet. Bathe or shower as directed by your health care provider. How is my port accessed? Special steps must be taken to access the port:  Before the port is accessed, a numbing cream can be placed on the skin. This helps numb the skin over the port site.  Your health care provider uses a sterile technique to access the port. ? Your health care provider must put on a mask and sterile gloves. ? The skin over your port is cleaned carefully with an antiseptic and allowed to dry. ? The port is gently pinched between sterile gloves, and a needle is inserted into the port.  Only "non-coring" port needles should be used to access the port. Once the port is accessed, a blood return should be checked. This helps ensure that the port  is in the vein and is not clogged.  If your port needs to remain accessed for a constant infusion, a clear (transparent) bandage will be placed over the needle site. The bandage and needle will need to be changed every week, or as directed by your health care provider.  Keep the bandage covering the needle clean and dry. Do not get it wet. Follow your health care provider's instructions on how to take a shower or bath while the port is accessed.  If your port does not need to stay accessed, no bandage is needed over the port.  What is flushing? Flushing helps keep the port from getting clogged. Follow your health care provider's instructions on how and when to flush the port. Ports are usually flushed with saline solution or a medicine called heparin. The need for flushing will depend on how the port is used.  If the port is used for intermittent medicines or blood draws, the port will need to be flushed: ? After medicines have been given. ? After blood has been drawn. ? As part of routine maintenance.  If a constant infusion is running, the port may not need to be flushed.  How long will my port stay implanted? The port can stay in for as long as your health care provider thinks it is needed. When it is time for the port to come out, surgery will be   done to remove it. The procedure is similar to the one performed when the port was put in. When should I seek immediate medical care? When you have an implanted port, you should seek immediate medical care if:  You notice a bad smell coming from the incision site.  You have swelling, redness, or drainage at the incision site.  You have more swelling or pain at the port site or the surrounding area.  You have a fever that is not controlled with medicine.  This information is not intended to replace advice given to you by your health care provider. Make sure you discuss any questions you have with your health care provider. Document  Released: 06/16/2005 Document Revised: 11/22/2015 Document Reviewed: 02/21/2013 Elsevier Interactive Patient Education  2017 [employer]  

## 2016-08-29 ENCOUNTER — Encounter (HOSPITAL_COMMUNITY): Payer: Federal, State, Local not specified - PPO

## 2016-08-29 NOTE — Patient Instructions (Signed)
Holly Knight  08/29/2016     '@PREFPERIOPPHARMACY'$ @   Your procedure is scheduled on  09/08/2016   Report to [facility] at  61  A.M.  Call this number if you have problems the morning of surgery:  [phone]   Remember:  Do not eat food or drink liquids after midnight.  Take these medicines the morning of surgery with A SIP OF WATER  Amlodipine, flexaril, remeron, prilosec, compazine, ultram. Take your symbicort and your nebulizer before you come and bring your rescue inhaler with you if you have one/   Do not wear jewelry, make-up or nail polish.  Do not wear lotions, powders, or perfumes, or deoderant.  Do not shave 48 hours prior to surgery.  Men may shave face and neck.  Do not bring valuables to the hospital.  [facility] is not responsible for any belongings or valuables.  Contacts, dentures or bridgework may not be worn into surgery.  Leave your suitcase in the car.  After surgery it may be brought to your room.  For patients admitted to the hospital, discharge time will be determined by your treatment team.  Patients discharged the day of surgery will not be allowed to drive home.   Name and phone number of your driver:   family Special instructions:  None  Please read over the following fact sheets that you were given. Anesthesia Post-op Instructions and Care and Recovery After Surgery       Implanted Port Insertion Implanted port insertion is a procedure to put in a port and catheter. The port is a device with an injectable disk that can be accessed by your health care provider. The port is connected to a vein in the chest or neck by a small flexible tube (catheter). There are different types of ports. The implanted port may be used as a long-term IV access for:  Medicines, such as chemotherapy.  Fluids.  Liquid nutrition, such as total parenteral nutrition (TPN).  Blood samples. Having a port means that your health care provider will not  need to use the veins in your arms for these procedures. Tell a health care provider about:  Any allergies you have.  All medicines you are taking, especially blood thinners, as well as any vitamins, herbs, eye drops, creams, over-the-counter medicines, and steroids.  Any problems you or family members have had with anesthetic medicines.  Any blood disorders you have.  Any surgeries you have had.  Any medical conditions you have, including diabetes or kidney problems.  Whether you are pregnant or may be pregnant. What are the risks? Generally, this is a safe procedure. However, problems may occur, including:  Allergic reactions to medicines or dyes.  Damage to other structures or organs.  Infection.  Damage to the blood vessel, bruising, or bleeding at the puncture site.  Blood clot.  Breakdown of the skin over the port.  A collection of air in the chest that can cause one of the lungs to collapse (pneumothorax). This is rare. What happens before the procedure? Staying hydrated  Follow instructions from your health care provider about hydration, which may include:  Up to 2 hours before the procedure - you may continue to drink clear liquids, such as water, clear fruit juice, black coffee, and plain tea. Eating and drinking restrictions   Follow instructions from your health care provider about eating and drinking, which may include:  8 hours before the procedure -  stop eating heavy meals or foods such as meat, fried foods, or fatty foods.  6 hours before the procedure - stop eating light meals or foods, such as toast or cereal.  6 hours before the procedure - stop drinking milk or drinks that contain milk.  2 hours before the procedure - stop drinking clear liquids. Medicines   Ask your health care provider about:  Changing or stopping your regular medicines. This is especially important if you are taking diabetes medicines or blood thinners.  Taking medicines  such as aspirin and ibuprofen. These medicines can thin your blood. Do not take these medicines before your procedure if your health care provider instructs you not to.  You may be given antibiotic medicine to help prevent infection. General instructions   Plan to have someone take you home from the hospital or clinic.  If you will be going home right after the procedure, plan to have someone with you for 24 hours.  You may have blood tests.  You may be asked to shower with a germ-killing soap. What happens during the procedure?  To lower your risk of infection:  Your health care team will wash or sanitize their hands.  Your skin will be washed with soap.  Hair may be removed from the surgical area.  An IV tube will be inserted into one of your veins.  You will be given one or more of the following:  A medicine to help you relax (sedative).  A medicine to numb the area (local anesthetic).  Two small cuts (incisions) will be made to insert the port.  One incision will be made in your neck to get access to the vein where the catheter will lie.  The other incision will be made in the upper chest. This is where the port will lie.  The procedure may be done using continuous X-ray (fluoroscopy) or other imaging tools for guidance.  The port and catheter will be placed. There may be a small, raised area where the port is.  The port will be flushed with a salt solution (saline), and blood will be drawn to make sure that it is working correctly.  The incisions will be closed.  Bandages (dressings) may be placed over the incisions. The procedure may vary among health care providers and hospitals. What happens after the procedure?  Your blood pressure, heart rate, breathing rate, and blood oxygen level will be monitored until the medicines you were given have worn off.  Do not drive for 24 hours if you were given a sedative.  You will be given a manufacturer's information  card for the type of port that you have. Keep this with you.  Your port will need to be flushed and checked as told by your health care provider, usually every few weeks.  A chest X-ray will be done to:  Check the placement of the port.  Make sure there is no injury to your lung. Summary  Implanted port insertion is a procedure to put in a port and catheter.  The implanted port is used as a long-term IV access.  The port will need to be flushed and checked as told by your health care provider, usually every few weeks.  Keep your manufacturer's information card with you at all times. This information is not intended to replace advice given to you by your health care provider. Make sure you discuss any questions you have with your health care provider. Document Released: 04/06/2013 Document Revised: 05/07/2016  Document Reviewed: 05/07/2016 Elsevier Interactive Patient Education  2017 [place] Insertion, Care After This sheet gives you information about how to care for yourself after your procedure. Your health care provider may also give you more specific instructions. If you have problems or questions, contact your health care provider. What can I expect after the procedure? After your procedure, it is common to have:  Discomfort at the port insertion site.  Bruising on the skin over the port. This should improve over 3-4 days. Follow these instructions at home: [facility] care   After your port is placed, you will get a manufacturer's information card. The card has information about your port. Keep this card with you at all times.  Take care of the port as told by your health care provider. Ask your health care provider if you or a family member can get training for taking care of the port at home. A home health care nurse may also take care of the port.  Make sure to remember what type of port you have. Incision care   Follow instructions from your health care  provider about how to take care of your port insertion site. Make sure you:  Wash your hands with soap and water before you change your bandage (dressing). If soap and water are not available, use hand sanitizer.  Change your dressing as told by your health care provider.  Leave stitches (sutures), skin glue, or adhesive strips in place. These skin closures may need to stay in place for 2 weeks or longer. If adhesive strip edges start to loosen and curl up, you may trim the loose edges. Do not remove adhesive strips completely unless your health care provider tells you to do that.  Check your port insertion site every day for signs of infection. Check for:  More redness, swelling, or pain.  More fluid or blood.  Warmth.  Pus or a bad smell. General instructions   Do not take baths, swim, or use a hot tub until your health care provider approves.  Do not lift anything that is heavier than 10 lb (4.5 kg) for a week, or as told by your health care provider.  Ask your health care provider when it is okay to:  Return to work or school.  Resume usual physical activities or sports.  Do not drive for 24 hours if you were given a medicine to help you relax (sedative).  Take over-the-counter and prescription medicines only as told by your health care provider.  Wear a medical alert bracelet in case of an emergency. This will tell any health care providers that you have a port.  Keep all follow-up visits as told by your health care provider. This is important. Contact a health care provider if:  You cannot flush your port with saline as directed, or you cannot draw blood from the port.  You have a fever or chills.  You have more redness, swelling, or pain around your port insertion site.  You have more fluid or blood coming from your port insertion site.  Your port insertion site feels warm to the touch.  You have pus or a bad smell coming from the port insertion site. Get help  right away if:  You have chest pain or shortness of breath.  You have bleeding from your port that you cannot control. Summary  Take care of the port as told by your health care provider.  Change your dressing as told by your  health care provider.  Keep all follow-up visits as told by your health care provider. This information is not intended to replace advice given to you by your health care provider. Make sure you discuss any questions you have with your health care provider. Document Released: 04/06/2013 Document Revised: 05/07/2016 Document Reviewed: 05/07/2016 Elsevier Interactive Patient Education  2017 [place] An implanted port is a type of central line that is placed under the skin. Central lines are used to provide IV access when treatment or nutrition needs to be given through a person's veins. Implanted ports are used for long-term IV access. An implanted port may be placed because:  You need IV medicine that would be irritating to the small veins in your hands or arms.  You need long-term IV medicines, such as antibiotics.  You need IV nutrition for a long period.  You need frequent blood draws for lab tests.  You need dialysis. Implanted ports are usually placed in the chest area, but they can also be placed in the upper arm, the abdomen, or the leg. An implanted port has two main parts:  Reservoir. The reservoir is round and will appear as a small, raised area under your skin. The reservoir is the part where a needle is inserted to give medicines or draw blood.  Catheter. The catheter is a thin, flexible tube that extends from the reservoir. The catheter is placed into a large vein. Medicine that is inserted into the reservoir goes into the catheter and then into the vein. How will I care for my incision site? Do not get the incision site wet. Bathe or shower as directed by your health care provider. How is my port accessed? Special  steps must be taken to access the port:  Before the port is accessed, a numbing cream can be placed on the skin. This helps numb the skin over the port site.  Your health care provider uses a sterile technique to access the port.  Your health care provider must put on a mask and sterile gloves.  The skin over your port is cleaned carefully with an antiseptic and allowed to dry.  The port is gently pinched between sterile gloves, and a needle is inserted into the port.  Only "non-coring" port needles should be used to access the port. Once the port is accessed, a blood return should be checked. This helps ensure that the port is in the vein and is not clogged.  If your port needs to remain accessed for a constant infusion, a clear (transparent) bandage will be placed over the needle site. The bandage and needle will need to be changed every week, or as directed by your health care provider.  Keep the bandage covering the needle clean and dry. Do not get it wet. Follow your health care provider's instructions on how to take a shower or bath while the port is accessed.  If your port does not need to stay accessed, no bandage is needed over the port. What is flushing? Flushing helps keep the port from getting clogged. Follow your health care provider's instructions on how and when to flush the port. Ports are usually flushed with saline solution or a medicine called heparin. The need for flushing will depend on how the port is used.  If the port is used for intermittent medicines or blood draws, the port will need to be flushed:  After medicines have been given.  After blood has been drawn.  As part of routine maintenance.  If a constant infusion is running, the port may not need to be flushed. How long will my port stay implanted? The port can stay in for as long as your health care provider thinks it is needed. When it is time for the port to come out, surgery will be done to remove it.  The procedure is similar to the one performed when the port was put in. When should I seek immediate medical care? When you have an implanted port, you should seek immediate medical care if:  You notice a bad smell coming from the incision site.  You have swelling, redness, or drainage at the incision site.  You have more swelling or pain at the port site or the surrounding area.  You have a fever that is not controlled with medicine. This information is not intended to replace advice given to you by your health care provider. Make sure you discuss any questions you have with your health care provider. Document Released: 06/16/2005 Document Revised: 11/22/2015 Document Reviewed: 02/21/2013 Elsevier Interactive Patient Education  2017 Elsevier Inc. PATIENT INSTRUCTIONS POST-ANESTHESIA  IMMEDIATELY FOLLOWING SURGERY:  Do not drive or operate machinery for the first twenty four hours after surgery.  Do not make any important decisions for twenty four hours after surgery or while taking narcotic pain medications or sedatives.  If you develop intractable nausea and vomiting or a severe headache please notify your doctor immediately.  FOLLOW-UP:  Please make an appointment with your surgeon as instructed. You do not need to follow up with anesthesia unless specifically instructed to do so.  WOUND CARE INSTRUCTIONS (if applicable):  Keep a dry clean dressing on the anesthesia/puncture wound site if there is drainage.  Once the wound has quit draining you may leave it open to air.  Generally you should leave the bandage intact for twenty four hours unless there is drainage.  If the epidural site drains for more than 36-48 hours please call the anesthesia department.  QUESTIONS?:  Please feel free to call your physician or the hospital operator if you have any questions, and they will be happy to assist you.

## 2016-09-02 ENCOUNTER — Other Ambulatory Visit (HOSPITAL_COMMUNITY): Payer: Federal, State, Local not specified - PPO

## 2016-09-02 ENCOUNTER — Ambulatory Visit (HOSPITAL_COMMUNITY): Payer: Federal, State, Local not specified - PPO

## 2016-09-02 ENCOUNTER — Encounter (HOSPITAL_COMMUNITY)
Admission: RE | Admit: 2016-09-02 | Discharge: 2016-09-02 | Disposition: A | Discharge status: Home / Self Care | Payer: Federal, State, Local not specified - PPO | Source: Ambulatory Visit | Attending: General Surgery | Admitting: General Surgery

## 2016-09-03 ENCOUNTER — Ambulatory Visit (HOSPITAL_COMMUNITY): Payer: Federal, State, Local not specified - PPO

## 2016-09-04 ENCOUNTER — Encounter (HOSPITAL_COMMUNITY): Payer: Federal, State, Local not specified - PPO | Attending: Oncology

## 2016-09-04 ENCOUNTER — Encounter (HOSPITAL_COMMUNITY): Payer: Self-pay

## 2016-09-04 ENCOUNTER — Encounter (HOSPITAL_COMMUNITY): Payer: Federal, State, Local not specified - PPO | Attending: Oncology | Admitting: Oncology

## 2016-09-04 ENCOUNTER — Other Ambulatory Visit: Payer: Self-pay

## 2016-09-04 ENCOUNTER — Encounter (HOSPITAL_COMMUNITY)
Admission: RE | Admit: 2016-09-04 | Discharge: 2016-09-04 | Disposition: A | Discharge status: Home / Self Care | Payer: Federal, State, Local not specified - PPO | Source: Ambulatory Visit | Attending: General Surgery | Admitting: General Surgery

## 2016-09-04 ENCOUNTER — Ambulatory Visit (HOSPITAL_COMMUNITY): Payer: Federal, State, Local not specified - PPO

## 2016-09-04 VITALS — BP 117/55 | HR 70 | Temp 98.8°F | Resp 16 | Wt 130.6 lb

## 2016-09-04 DIAGNOSIS — C3492 Malignant neoplasm of unspecified part of left bronchus or lung: Secondary | ICD-10-CM | POA: Diagnosis present

## 2016-09-04 DIAGNOSIS — Z7902 Long term (current) use of antithrombotics/antiplatelets: Secondary | ICD-10-CM | POA: Insufficient documentation

## 2016-09-04 DIAGNOSIS — C771 Secondary and unspecified malignant neoplasm of intrathoracic lymph nodes: Secondary | ICD-10-CM | POA: Diagnosis not present

## 2016-09-04 DIAGNOSIS — Z87891 Personal history of nicotine dependence: Secondary | ICD-10-CM | POA: Diagnosis not present

## 2016-09-04 DIAGNOSIS — C3490 Malignant neoplasm of unspecified part of unspecified bronchus or lung: Secondary | ICD-10-CM

## 2016-09-04 DIAGNOSIS — I10 Essential (primary) hypertension: Secondary | ICD-10-CM

## 2016-09-04 DIAGNOSIS — Z85118 Personal history of other malignant neoplasm of bronchus and lung: Secondary | ICD-10-CM | POA: Diagnosis not present

## 2016-09-04 DIAGNOSIS — R590 Localized enlarged lymph nodes: Secondary | ICD-10-CM | POA: Insufficient documentation

## 2016-09-04 DIAGNOSIS — J449 Chronic obstructive pulmonary disease, unspecified: Secondary | ICD-10-CM | POA: Insufficient documentation

## 2016-09-04 DIAGNOSIS — Z79899 Other long term (current) drug therapy: Secondary | ICD-10-CM | POA: Insufficient documentation

## 2016-09-04 DIAGNOSIS — G939 Disorder of brain, unspecified: Secondary | ICD-10-CM | POA: Diagnosis not present

## 2016-09-04 DIAGNOSIS — Z881 Allergy status to other antibiotic agents status: Secondary | ICD-10-CM | POA: Insufficient documentation

## 2016-09-04 DIAGNOSIS — R938 Abnormal findings on diagnostic imaging of other specified body structures: Secondary | ICD-10-CM | POA: Diagnosis present

## 2016-09-04 DIAGNOSIS — Z7982 Long term (current) use of aspirin: Secondary | ICD-10-CM | POA: Insufficient documentation

## 2016-09-04 DIAGNOSIS — Z880 Allergy status to penicillin: Secondary | ICD-10-CM | POA: Diagnosis not present

## 2016-09-04 LAB — CBC WITH DIFFERENTIAL/PLATELET
BASOS ABS: 0 10*3/uL (ref 0.0–0.1)
Basophils Relative: 2 %
EOS ABS: 0 10*3/uL (ref 0.0–0.7)
Eosinophils Relative: 1 %
HEMATOCRIT: 32.7 % — AB (ref 36.0–46.0)
HEMOGLOBIN: 11.1 g/dL — AB (ref 12.0–15.0)
LYMPHS PCT: 74 %
Lymphs Abs: 1.4 10*3/uL (ref 0.7–4.0)
MCH: 30.4 pg (ref 26.0–34.0)
MCHC: 33.9 g/dL (ref 30.0–36.0)
MCV: 89.6 fL (ref 78.0–100.0)
MONOS PCT: 10 %
Monocytes Absolute: 0.2 10*3/uL (ref 0.1–1.0)
NEUTROS PCT: 13 %
Neutro Abs: 0.2 10*3/uL — ABNORMAL LOW (ref 1.7–7.7)
Platelets: 46 10*3/uL — ABNORMAL LOW (ref 150–400)
RBC: 3.65 MIL/uL — AB (ref 3.87–5.11)
RDW: 13.6 % (ref 11.5–15.5)
WBC: 1.8 10*3/uL — AB (ref 4.0–10.5)

## 2016-09-04 LAB — COMPREHENSIVE METABOLIC PANEL
ALBUMIN: 3.5 g/dL (ref 3.5–5.0)
ALK PHOS: 71 U/L (ref 38–126)
ALT: 18 U/L (ref 14–54)
ANION GAP: 8 (ref 5–15)
AST: 16 U/L (ref 15–41)
BILIRUBIN TOTAL: 0.4 mg/dL (ref 0.3–1.2)
BUN: 11 mg/dL (ref 6–20)
CALCIUM: 9.9 mg/dL (ref 8.9–10.3)
CO2: 26 mmol/L (ref 22–32)
Chloride: 103 mmol/L (ref 101–111)
Creatinine, Ser: 0.57 mg/dL (ref 0.44–1.00)
GFR calc Af Amer: >60 mL/min (ref >60)
GFR calc non Af Amer: >60 mL/min (ref >60)
GLUCOSE: 86 mg/dL (ref 65–99)
Potassium: 4.2 mmol/L (ref 3.5–5.1)
SODIUM: 137 mmol/L (ref 135–145)
TOTAL PROTEIN: 7.2 g/dL (ref 6.5–8.1)

## 2016-09-04 NOTE — Patient Instructions (Signed)
[place] at [facility] Discharge Instructions  RECOMMENDATIONS MADE BY THE CONSULTANT AND ANY TEST RESULTS WILL BE SENT TO YOUR REFERRING PHYSICIAN.  You were seen today by Dr. Twana First Follow up 3/19  See Amy up front for appointments   Thank you for choosing [place] at [facility] to provide your oncology and hematology care.  To afford each patient quality time with our provider, please arrive at least 15 minutes before your scheduled appointment time.    If you have a lab appointment with the [facility] please come in thru the  Main Entrance and check in at the main information desk  You need to re-schedule your appointment should you arrive 10 or more minutes late.  We strive to give you quality time with our providers, and arriving late affects you and other patients whose appointments are after yours.  Also, if you no show three or more times for appointments you may be dismissed from the clinic at the providers discretion.     Again, thank you for choosing [facility].  Our hope is that these requests will decrease the amount of time that you wait before being seen by our physicians.       _____________________________________________________________  Should you have questions after your visit to [facility], please contact our office at [phone] between the hours of 8:30 a.m. and 4:30 p.m.  Voicemails left after 4:30 p.m. will not be returned until the following business day.  For prescription refill requests, have your pharmacy contact our office.       Resources For Cancer Patients and their Caregivers ? American Cancer Society: Can assist with transportation, wigs, general needs, runs Look Good Feel Better.        (815)309-2153 ? Cancer Care: Provides financial assistance, online support groups, medication/co-pay assistance.  1-800-813-HOPE 450-293-1290) ? [facility] Assists [place] cancer patients and their families through emotional , educational and financial support.  8737587474 ? [place] DSS Where to apply for food stamps, Medicaid and utility assistance. (972) 534-2863 ? [facility]: Transportation to medical appointments. 413-803-0504 ? Social Security Administration: May apply for disability if have a Stage IV cancer. 561-788-4732 615-032-4863 ? [employer], Disability and Transit Services: Assists with nutrition, care and transit needs. [place] Support Programs: '@10RELATIVEDAYS'$ @ > Cancer Support Group  2nd Tuesday of the month 1pm-2pm, Journey Room  > Creative Journey  3rd Tuesday of the month 1130am-1pm, Journey Room  > Look Good Feel Better  1st Wednesday of the month 10am-12 noon, Journey Room (Call [place] to register [phone])

## 2016-09-04 NOTE — Progress Notes (Signed)
[facility] Hematology/Oncology Progress Note  Name: Holly Knight      MRN: [id number]    Location: Room/bed info not found  Date: 09/04/2016 Time:10:41 AM   REFERRING PHYSICIAN:  Laurena Slimmer, MD (Med Onc in [place], [place])  [place]:  Newly diagnosed, limited stage small cell lung cancer.   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  HISTORY OF PRESENT ILLNESS:   Holly Knight is a 56 y.o. female with a medical history significant for hypertension, history of ulnar embolism, carotid artery stenosis, history of one congenital kidney, significant back issues hindering ability to ambulate who is referred to the [facility] for newly diagnosed, limited stage small cell lung cancer with PET repeat on 08/20/2016 showing a stable 1.2 cm nodule in the right lung apex which demonstrates moderate to intense FDG avidity, interval increase in size of left hilar lymphadenopathy with intense FDG avidity, and stable 9 mm minimally to mildly FDG avid pulmonary nodule in the left lower lobe.  No new sites of disease within the abdomen or pelvis or osseous structures.    Small cell lung cancer ([place])   05/21/2016 Imaging    CT chest wo contrast in [place], [place]-increased size of the nodule in the right lung apex.  The postsurgical changes, scarring and interstitial fibrotic changes in the right lung are otherwise stable.  Repeat surveillance evaluation of the right lung in 3-4 months with CT chest is recommended.  Stable prominent mediastinal lymph nodes.  No new adenopathy within the mediastinum.  There are stable chronic changes within the abdomen.      06/11/2016 PET scan    PET in [place], [place]- Slightly spiculated nodule within the posterior aspect of the right lung apex described on previous study is FDG avid with a maximal SUV of 6.43.  There has also been interval development of FDG avid mass in the left hilar region when  compared to prior study.  The nodule within the apex of the right upper lobe is FDG avid and has increased in size described on prior CTs.  These findings are concerning for recurrent disease.  FDG avid nodules within the left hilar region likely representing lymph nodes.  Differential considerations are metastatic lymph nodes versus active lymph nodes from infection or inflammatory changes.      07/29/2016 Pathology Results    Left hilar lymph node (level 10L)- positive for neoplasia.  A small panel of immunostains is performed to confirm the diagnosis.  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      08/19/2016 Imaging    MRI brain in [place], [place]-postcontrast images shows 2 rounded enhancing foci in the posterior aspect of the left occipital lobe measures 6 mm and 4 mm in size.  These findings are highly suspicious for metastatic lesions.  Old right parietal lobe infarct.  Mucosal thickening in the right maxillary antrum consistent with chronic sinusitis.  Debris is noted within the sinus.  Mucosal thickening is also noted in the left maxillary antrum and ethmoid sinuses.      08/20/2016 PET scan    PET in [place], [place]- stable 1.2 cm nodule in the right lung apex which demonstrates moderate to intense FDG avidity, interval increase in size of left hilar lymphadenopathy with intense FDG avidity,  and stable 9 mm minimally to mildly FDG avid pulmonary nodule in the left lower lobe.  No new sites of disease within the abdomen or pelvis or osseous structures.        Squamous cell carcinoma of right lung ([place])   01/21/2013 PET scan    PET scan and [place], [place]-hypermetabolic activity corresponding to the right upper lobe malignancy, SUV 8.3.  Also linear increased uptake identified within the anterior and left lateral aspect of the base of tongue maximum SUV of  15.  No evidence of any other hypermetabolic activity identified.  The area of increased uptake at the left lateral base of tongue was stated to potentially represent inflammation, but direct visualization was recommended.      02/08/2013 Miscellaneous    Approximate date.  Patient underwent direct laryngoscopy by ENT no lesion identified in her oropharynx.      03/13/2013 Procedure    Right upper lobectomy with small wedge of the right lower lobe as well as lymph node dissection with right tracheobronchial level 7 subcarinal and right lower paratracheal 4R and azygous lymph node biopsies.  All lymph node samples were negative for tumor.  Patient noted to have squamous cell carcinoma of right upper lobe measuring 2 cm.  Surgical margins were free of tumor.  No alvei was identified.  Tumor did extend to the visceropleural but did not invade.  Patient's ultimate staging was pT1 pN0 M0      04/07/2013 Pathology Results    Pathology from right upper lobectomy with small wedge of the right lower lobe as well as lymph node dissection with right tracheobronchial level 7 subcarinal and right lower paratracheal 4R and azygous lymph node biopsies.  All lymph node samples were negative for tumor.  Patient noted to have squamous cell carcinoma of right upper lobe measuring 2 cm.  Surgical margins were free of tumor.  No alvei was identified.  Tumor did extend to the visceropleural but did not invade.  Patient's ultimate staging was pT1 pN0 M0       She is here today with newly diagnosed small cell lung cancer and a biopsy-proven left hilar lymph node.  PET imaging in [place], [place] demonstrated a stable 1.2 cm nodule in the right lung apex with hypermetabolic activity, interval increase in size of left hilar lymphadenopathy with intense hypermetabolic activity and a stable 9 mm minimally to mildly hypermetabolic in the left lower lobe without any other sites of metastatic disease.  Her initial PET scan was  back in December followed by a biopsy in [place] on July 29, 2016.  Due to the significant time difference between PET imaging and biopsy-proven disease, we have repeated a PET scan that is outlined above.   Mrs. Amezcua presents today for continued follow up. She presents in a wheelchair.  She received cycle 1 of cisplatin+etoposide on 08/24/16. She notes a few days after her first treatment she had gas and indigestion, and sore throat. She said she took some over the counter medicine and it resolved.   She notes she had a slight fever and took a tylenol and it resolved it.   She has met with Dr. Gwendlyn Deutscher, MD of [facility] in their [place] clinic. Unclear what the plan was in terms of start date for Rt.   She will be getting her port placed on Monday.   Denies nausea, vomiting, abdominal pain, chest pain, SOB, headaches.     Review of Systems  Constitutional: Positive for fever (  slight fever, took tylenol and it resolved.).  HENT: Positive for sore throat (a few days after treatment, took over the counter medicine and it resolved).   Eyes: Negative.   Cardiovascular: Negative.   Gastrointestinal: Negative.  Negative for nausea.       Gas and indigestion a few days after her treatment, she took over the counter medicine and it resolved   Genitourinary: Negative.   Musculoskeletal: Negative.   Skin: Negative.   Neurological: Negative.   Endo/Heme/Allergies: Negative.   Psychiatric/Behavioral: Negative.      PAST MEDICAL HISTORY:   Past Medical History:  Diagnosis Date  . Anxiety   . BPPV (benign paroxysmal positional vertigo)    takes valium  . Cancer ([place]) 01/2013  . Complication of anesthesia 02/10/2013   BP dropped too low and had a sroke  . COPD (chronic obstructive pulmonary disease) ([place])   . Coronary artery disease   . H/O hiatal hernia   . Hypertension   . Lung cancer ([place])   . Peripheral vascular disease ([facility])    PAD-   . Pulmonary embolism ([place])  1985ish   due to birth control pills  . Small cell lung cancer ([place]) 08/21/2016  . Squamous cell carcinoma of right lung ([place]) 08/21/2016  . Stroke [facility]) 2014   left hand weakness - PT     ALLERGIES: Allergies  Allergen Reactions  . Amoxicillin Anaphylaxis    Has patient had a PCN reaction causing immediate rash, facial/tongue/throat swelling, SOB or lightheadedness with hypotension:Yes Has patient had a PCN reaction causing severe rash involving mucus membranes or skin necrosis:No Has patient had a PCN reaction that required hospitalization:Treated in ER Has patient had a PCN reaction occurring within the last 10 years:No If all of the above answers are "NO", then may proceed with Cephalosporin use.    Marland Kitchen Penicillins Anaphylaxis    Has patient had a PCN reaction causing immediate rash, facial/tongue/throat swelling, SOB or lightheadedness with hypotension:Yes Has patient had a PCN reaction causing severe rash involving mucus membranes or skin necrosis:No Has patient had a PCN reaction that required hospitalization:Treated in ER Has patient had a PCN reaction occurring within the last 10 years:No If all of the above answers are "NO", then may proceed with Cephalosporin use.   . Sulfa Antibiotics Rash  . Beta Adrenergic Blockers Other (See Comments)    Caused Hypotension  . Pecan Extract Allergy Skin Test Other (See Comments)    Flu-like symptoms.      MEDICATIONS: I have reviewed the patient's current medications.    Current Outpatient Prescriptions on File Prior to Visit  Medication Sig Dispense Refill  . amLODipine-benazepril (LOTREL) 5-20 MG per capsule Take 1 capsule by mouth 2 (two) times daily.    Marland Kitchen aspirin EC 81 MG tablet Take 81 mg by mouth daily.    . Aspirin-Acetaminophen-Caffeine (GOODYS EXTRA STRENGTH) 520-260-32.5 MG PACK Take 1 packet by mouth every 8 (eight) hours as needed (for pain/headache.).    Marland Kitchen atorvastatin (LIPITOR) 20 MG tablet Take 20 mg by mouth at  bedtime.    Marland Kitchen azelastine (ASTELIN) 0.1 % nasal spray Place 1-2 sprays into both nostrils 2 (two) times daily as needed (for allergies/nasal congestion.).     Marland Kitchen baclofen (LIORESAL) 10 MG tablet Take 10 mg by mouth 2 (two) times daily.     . budesonide-formoterol (SYMBICORT) 160-4.5 MCG/ACT inhaler Inhale 2 puffs twice daily    . chlorpheniramine-HYDROcodone (TUSSIONEX PENNKINETIC ER) 10-8 MG/5ML SUER Take 5 mLs by  mouth every 12 (twelve) hours as needed for cough. 140 mL 0  . Cholecalciferol (VITAMIN D3) 2000 units capsule Take 2,000 Units by mouth daily.     Marland Kitchen CISPLATIN IV Inject 100 mg into the vein once.     . cyclobenzaprine (FLEXERIL) 10 MG tablet TAKE 1 TABLET THREE TIMES A DAY AS NEEDED FOR MUSCLE SPASMS    . dexamethasone (DECADRON) 4 MG tablet Take 2 tablets two times a day for 1 day on day 4 after cisplatin chemotherapy. Take with food. 30 tablet 1  . diazepam (VALIUM) 2 MG tablet Take 2 mg by mouth at bedtime.     . ETOPOSIDE IV Inject 200 mg into the vein once.     . fluticasone (FLONASE) 50 MCG/ACT nasal spray Place 1-2 sprays into both nostrils daily as needed (for allergies/nasal congestionl).     . folic acid (FOLVITE) 1 MG tablet TAKE 2 TABLETS (2 MG) BY MOUTH TWICE DAILY    . hydrALAZINE (APRESOLINE) 50 MG tablet Take 50 mg by mouth 2 (two) times daily.    Marland Kitchen ipratropium-albuterol (DUONEB) 0.5-2.5 (3) MG/3ML SOLN Inhale 3 mLs into the lungs every 6 (six) hours as needed (for wheezing/shortness of breath.).     Marland Kitchen l-methylfolate-B6-B12 (METANX) 3-35-2 MG TABS tablet Take 1 tablet by mouth 2 (two) times daily.     Marland Kitchen levofloxacin (LEVAQUIN) 500 MG tablet Take 1 tablet (500 mg total) by mouth daily. (Patient not taking: Reported on 08/28/2016) 5 tablet 0  . lidocaine-prilocaine (EMLA) cream Apply to affected area once (Patient taking differently: Apply 1 application topically daily as needed (prior to port being access). ) 30 g 3  . LORazepam (ATIVAN) 0.5 MG tablet Take 1 tablet (0.5 mg  total) by mouth every 6 (six) hours as needed (Nausea or vomiting). 30 tablet 0  . mirtazapine (REMERON) 30 MG tablet Take 15 mg by mouth at bedtime.    . Omega-3 Fatty Acids (OMEGA 3 500 PO) Take 500 mg by mouth 2 (two) times daily.    Marland Kitchen omeprazole (PRILOSEC) 20 MG capsule Take 20 mg by mouth daily as needed (for acid reflux/gerd.).     Marland Kitchen ondansetron (ZOFRAN) 8 MG tablet Take 1 tablet (8 mg total) by mouth 2 (two) times daily as needed. Start on the third day after cisplatin chemotherapy. 30 tablet 1  . Pegfilgrastim (NEULASTA ONPRO [place]) Inject 6 mg into the skin once.     Marland Kitchen PROAIR HFA 108 (90 Base) MCG/ACT inhaler Inhale 1-2 puffs into the lungs every 6 (six) hours as needed for wheezing or shortness of breath.    . prochlorperazine (COMPAZINE) 10 MG tablet Take 1 tablet (10 mg total) by mouth every 6 (six) hours as needed (Nausea or vomiting). 30 tablet 1  . tiotropium (SPIRIVA HANDIHALER) 18 MCG inhalation capsule Inhale 1 capsule (18 MCG) daily as needed for respiratory issues.    . traMADol (ULTRAM) 50 MG tablet Take 50 mg by mouth 4 (four) times daily as needed. For pain.     No current facility-administered medications on file prior to visit.      PAST SURGICAL HISTORY Past Surgical History:  Procedure Laterality Date  . BACK SURGERY    . CERVICAL SPINE SURGERY    . CORONARY ANGIOPLASTY  12/2011    stent  . CYST EXCISION Right 1989   breast  . SALIVARY STONE REMOVAL N/A 04/13/2013   Procedure: INTRAORAL EXCISION SUBMANDIBULAR STONES ;  Surgeon: Izora Gala, MD;  Location: [facility];  Service: ENT;  Laterality: N/A;  . TUMOR REMOVAL Right 02/10/2013   lung  . UTERINE FIBROID EMBOLIZATION  2005  . UTERINE FIBROID SURGERY     x2    FAMILY HISTORY: Family History  Problem Relation Age of Onset  . Cancer Mother     breast  . Seizures Sister     SOCIAL HISTORY:  reports that she quit smoking about 3 years ago. Her smoking use included Cigarettes. She quit after 35.00 years of use.  She has never used smokeless tobacco. She reports that she does not drink alcohol or use drugs.  Social History   Social History  . Marital status: Divorced    Spouse name: N/A  . Number of children: N/A  . Years of education: N/A   Social History Main Topics  . Smoking status: Former Smoker    Years: 35.00    Types: Cigarettes    Quit date: 02/10/2013  . Smokeless tobacco: Never Used  . Alcohol use No     Comment: 2- 3 a month  . Drug use: No  . Sexual activity: Not Currently   Other Topics Concern  . Not on file   Social History Narrative  . No narrative on file    PERFORMANCE STATUS: The patient's performance status is 2 - Symptomatic, <50% confined to bed  PHYSICAL EXAM:  Vitals:   09/04/16 1041  BP: (!) 117/55  Pulse: 70  Resp: 16  Temp: 98.8 F (37.1 C)   Filed Weights   09/04/16 1041  Weight: 130 lb 9.6 oz (59.2 kg)      Physical Exam  Constitutional: She is oriented to person, place, and time and well-developed, well-nourished, and in no distress.  Physical exam was performed in a wheelchair.  HENT:  Head: Normocephalic and atraumatic.  Eyes: Conjunctivae and EOM are normal. Pupils are equal, round, and reactive to light.  Neck: Normal range of motion. Neck supple.  Cardiovascular: Normal rate, regular rhythm and normal heart sounds.   Pulmonary/Chest: Effort normal and breath sounds normal.  Abdominal: Soft. Bowel sounds are normal.  Musculoskeletal: Normal range of motion.  Neurological: She is alert and oriented to person, place, and time. Gait normal.  Skin: Skin is warm and dry.  Nursing note and vitals reviewed.   LABORATORY DATA:  No results found for this or any previous visit (from the past 48 hour(s)).    RADIOGRAPHY: No results found.                             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  PLAN: I have discussed MRI brain results in detail with the patient, she has 2 subcentimeter lesions in the occipital lobe suspected to be metastatic disease from lung primary. I have discussed that once she completes systemic therapy, then I would recommend whole brain RT following completion of systemic therapy.  She tolerated cycle 1 of cisplatin+etoposide well. I will get the last consultation note that patient had with Dr. Rhunette Croft [phone]). Proceed with chemoport placement on 09/08/16. RTC for follow up with cycle 2 on 09/15/16.     All questions were answered. The patient knows to call the clinic with any problems, questions or concerns. We can certainly see the patient much sooner if necessary.  This document serves as a  record of services personally performed by Twana First, MD. It was created on her behalf by Shirlean Mylar, a trained medical scribe. The creation of this record is based on the scribe's personal observations and the provider's statements to them. This document has been checked and approved by the attending provider.  I have reviewed the above documentation for accuracy and completeness and I agree with the above.  This note is electronically signed by: Mikey College 09/04/2016 10:41 AM

## 2016-09-09 ENCOUNTER — Encounter (HOSPITAL_COMMUNITY): Payer: Self-pay

## 2016-09-09 ENCOUNTER — Telehealth (HOSPITAL_COMMUNITY): Payer: Self-pay | Admitting: Emergency Medicine

## 2016-09-09 NOTE — Telephone Encounter (Signed)
Claiborne Billings called from [place] office and Holly Knight did not show up for her port appt on Monday.  She is re-scheduled for 09-15-2016 at 9 am.  Arnoldo Morale will leave her accessed.  She will get cisplatin on day 2 instead of day 1 and I had Tom fix the treatment plan.

## 2016-09-10 ENCOUNTER — Encounter (HOSPITAL_COMMUNITY)
Admission: RE | Admit: 2016-09-10 | Discharge: 2016-09-10 | Disposition: A | Discharge status: Home / Self Care | Payer: Federal, State, Local not specified - PPO | Source: Ambulatory Visit | Attending: General Surgery | Admitting: General Surgery

## 2016-09-11 ENCOUNTER — Telehealth (HOSPITAL_COMMUNITY): Payer: Self-pay | Admitting: *Deleted

## 2016-09-11 NOTE — Telephone Encounter (Signed)
Patient states she is not able to get out and go to her "other doctor" and wants to know if the provider here would be willing to write prescriptions for her baclofen and flexeril. Explained to patient I would ask the MD and get back with her tomorrow.

## 2016-09-12 ENCOUNTER — Other Ambulatory Visit (HOSPITAL_COMMUNITY): Payer: Self-pay | Admitting: *Deleted

## 2016-09-12 MED ORDER — CYCLOBENZAPRINE HCL 10 MG PO TABS: 10.0000 mg | ORAL_TABLET | Freq: Three times a day (TID) | ORAL | 0 refills | Status: AC | PRN

## 2016-09-12 MED ORDER — BACLOFEN 10 MG PO TABS: 10.0000 mg | ORAL_TABLET | Freq: Two times a day (BID) | ORAL | 1 refills | Status: AC

## 2016-09-15 ENCOUNTER — Ambulatory Visit (HOSPITAL_COMMUNITY): Payer: Federal, State, Local not specified - PPO | Admitting: Anesthesiology

## 2016-09-15 ENCOUNTER — Ambulatory Visit (HOSPITAL_BASED_OUTPATIENT_CLINIC_OR_DEPARTMENT_OTHER): Payer: Federal, State, Local not specified - PPO

## 2016-09-15 ENCOUNTER — Ambulatory Visit (HOSPITAL_COMMUNITY): Payer: Federal, State, Local not specified - PPO

## 2016-09-15 ENCOUNTER — Encounter (HOSPITAL_COMMUNITY)
Admission: RE | Disposition: A | Discharge status: Home / Self Care | Payer: Self-pay | Source: Ambulatory Visit | Attending: General Surgery

## 2016-09-15 ENCOUNTER — Ambulatory Visit (HOSPITAL_COMMUNITY)
Admission: RE | Admit: 2016-09-15 | Discharge: 2016-09-15 | Disposition: A | Discharge status: Home / Self Care | Payer: Federal, State, Local not specified - PPO | Source: Ambulatory Visit | Attending: General Surgery | Admitting: General Surgery

## 2016-09-15 ENCOUNTER — Ambulatory Visit (HOSPITAL_COMMUNITY): Payer: Federal, State, Local not specified - PPO | Admitting: Adult Health

## 2016-09-15 ENCOUNTER — Encounter (HOSPITAL_COMMUNITY): Payer: Self-pay

## 2016-09-15 ENCOUNTER — Encounter (HOSPITAL_COMMUNITY): Payer: Self-pay | Admitting: *Deleted

## 2016-09-15 VITALS — BP 110/66 | HR 93 | Temp 98.9°F | Resp 18 | Wt 124.6 lb

## 2016-09-15 DIAGNOSIS — C3492 Malignant neoplasm of unspecified part of left bronchus or lung: Secondary | ICD-10-CM

## 2016-09-15 DIAGNOSIS — C3432 Malignant neoplasm of lower lobe, left bronchus or lung: Secondary | ICD-10-CM

## 2016-09-15 DIAGNOSIS — M545 Low back pain: Secondary | ICD-10-CM | POA: Diagnosis not present

## 2016-09-15 DIAGNOSIS — K219 Gastro-esophageal reflux disease without esophagitis: Secondary | ICD-10-CM | POA: Insufficient documentation

## 2016-09-15 DIAGNOSIS — Z5111 Encounter for antineoplastic chemotherapy: Secondary | ICD-10-CM | POA: Diagnosis not present

## 2016-09-15 DIAGNOSIS — K449 Diaphragmatic hernia without obstruction or gangrene: Secondary | ICD-10-CM | POA: Insufficient documentation

## 2016-09-15 DIAGNOSIS — Z881 Allergy status to other antibiotic agents status: Secondary | ICD-10-CM | POA: Insufficient documentation

## 2016-09-15 DIAGNOSIS — I2510 Atherosclerotic heart disease of native coronary artery without angina pectoris: Secondary | ICD-10-CM | POA: Insufficient documentation

## 2016-09-15 DIAGNOSIS — C771 Secondary and unspecified malignant neoplasm of intrathoracic lymph nodes: Secondary | ICD-10-CM

## 2016-09-15 DIAGNOSIS — J449 Chronic obstructive pulmonary disease, unspecified: Secondary | ICD-10-CM | POA: Diagnosis not present

## 2016-09-15 DIAGNOSIS — G8929 Other chronic pain: Secondary | ICD-10-CM | POA: Diagnosis not present

## 2016-09-15 DIAGNOSIS — Z86711 Personal history of pulmonary embolism: Secondary | ICD-10-CM | POA: Diagnosis not present

## 2016-09-15 DIAGNOSIS — Z7982 Long term (current) use of aspirin: Secondary | ICD-10-CM | POA: Diagnosis not present

## 2016-09-15 DIAGNOSIS — Z79899 Other long term (current) drug therapy: Secondary | ICD-10-CM | POA: Insufficient documentation

## 2016-09-15 DIAGNOSIS — C3491 Malignant neoplasm of unspecified part of right bronchus or lung: Secondary | ICD-10-CM

## 2016-09-15 DIAGNOSIS — Z7902 Long term (current) use of antithrombotics/antiplatelets: Secondary | ICD-10-CM | POA: Diagnosis not present

## 2016-09-15 DIAGNOSIS — R630 Anorexia: Secondary | ICD-10-CM

## 2016-09-15 DIAGNOSIS — Z7951 Long term (current) use of inhaled steroids: Secondary | ICD-10-CM | POA: Diagnosis not present

## 2016-09-15 DIAGNOSIS — Z8673 Personal history of transient ischemic attack (TIA), and cerebral infarction without residual deficits: Secondary | ICD-10-CM | POA: Diagnosis not present

## 2016-09-15 DIAGNOSIS — Z87891 Personal history of nicotine dependence: Secondary | ICD-10-CM | POA: Diagnosis not present

## 2016-09-15 DIAGNOSIS — F419 Anxiety disorder, unspecified: Secondary | ICD-10-CM | POA: Insufficient documentation

## 2016-09-15 DIAGNOSIS — R634 Abnormal weight loss: Secondary | ICD-10-CM | POA: Diagnosis not present

## 2016-09-15 DIAGNOSIS — C3490 Malignant neoplasm of unspecified part of unspecified bronchus or lung: Secondary | ICD-10-CM

## 2016-09-15 DIAGNOSIS — Z452 Encounter for adjustment and management of vascular access device: Secondary | ICD-10-CM

## 2016-09-15 DIAGNOSIS — I10 Essential (primary) hypertension: Secondary | ICD-10-CM | POA: Insufficient documentation

## 2016-09-15 DIAGNOSIS — Z95828 Presence of other vascular implants and grafts: Secondary | ICD-10-CM

## 2016-09-15 DIAGNOSIS — I739 Peripheral vascular disease, unspecified: Secondary | ICD-10-CM | POA: Diagnosis not present

## 2016-09-15 DIAGNOSIS — Z880 Allergy status to penicillin: Secondary | ICD-10-CM | POA: Diagnosis not present

## 2016-09-15 HISTORY — PX: PORTACATH PLACEMENT: SHX2246

## 2016-09-15 LAB — CBC WITH DIFFERENTIAL/PLATELET
Basophils Absolute: 0 10*3/uL (ref 0.0–0.1)
Basophils Relative: 0 %
EOS ABS: 0 10*3/uL (ref 0.0–0.7)
Eosinophils Relative: 1 %
HCT: 32.5 % — ABNORMAL LOW (ref 36.0–46.0)
Hemoglobin: 11.1 g/dL — ABNORMAL LOW (ref 12.0–15.0)
LYMPHS ABS: 3 10*3/uL (ref 0.7–4.0)
LYMPHS PCT: 40 %
MCH: 30.1 pg (ref 26.0–34.0)
MCHC: 34.2 g/dL (ref 30.0–36.0)
MCV: 88.1 fL (ref 78.0–100.0)
MONO ABS: 0.7 10*3/uL (ref 0.1–1.0)
MONOS PCT: 9 %
Neutro Abs: 3.9 10*3/uL (ref 1.7–7.7)
Neutrophils Relative %: 50 %
PLATELETS: 474 10*3/uL — AB (ref 150–400)
RBC: 3.69 MIL/uL — ABNORMAL LOW (ref 3.87–5.11)
RDW: 14.3 % (ref 11.5–15.5)
WBC: 7.7 10*3/uL (ref 4.0–10.5)

## 2016-09-15 LAB — COMPREHENSIVE METABOLIC PANEL
ALT: 22 U/L (ref 14–54)
ANION GAP: 8 (ref 5–15)
AST: 23 U/L (ref 15–41)
Albumin: 3.7 g/dL (ref 3.5–5.0)
Alkaline Phosphatase: 86 U/L (ref 38–126)
BUN: 9 mg/dL (ref 6–20)
CHLORIDE: 103 mmol/L (ref 101–111)
CO2: 23 mmol/L (ref 22–32)
Calcium: 10.5 mg/dL — ABNORMAL HIGH (ref 8.9–10.3)
Creatinine, Ser: 0.61 mg/dL (ref 0.44–1.00)
Glucose, Bld: 102 mg/dL — ABNORMAL HIGH (ref 65–99)
POTASSIUM: 4.2 mmol/L (ref 3.5–5.1)
SODIUM: 134 mmol/L — AB (ref 135–145)
Total Bilirubin: 0.2 mg/dL — ABNORMAL LOW (ref 0.3–1.2)
Total Protein: 7.1 g/dL (ref 6.5–8.1)

## 2016-09-15 LAB — MAGNESIUM: MAGNESIUM: 1.7 mg/dL (ref 1.7–2.4)

## 2016-09-15 SURGERY — INSERTION, TUNNELED CENTRAL VENOUS DEVICE, WITH PORT
Anesthesia: Monitor Anesthesia Care | Laterality: Right

## 2016-09-15 MED ORDER — MIDAZOLAM HCL 2 MG/2ML IJ SOLN
1.0000 mg | INTRAMUSCULAR | Status: AC
  Administered 2016-09-15: 2 mg via INTRAVENOUS

## 2016-09-15 MED ORDER — SODIUM CHLORIDE 0.9 % IV SOLN
120.0000 mg/m2 | Freq: Once | INTRAVENOUS | Status: AC
  Administered 2016-09-15: 200 mg via INTRAVENOUS
  Filled 2016-09-15: qty 10

## 2016-09-15 MED ORDER — LIDOCAINE HCL (PF) 1 % IJ SOLN
INTRAMUSCULAR | Status: DC | PRN
  Administered 2016-09-15: 10 mL

## 2016-09-15 MED ORDER — HEPARIN SOD (PORK) LOCK FLUSH 100 UNIT/ML IV SOLN
500.0000 [IU] | Freq: Once | INTRAVENOUS | Status: AC | PRN
  Administered 2016-09-15: 500 [IU]

## 2016-09-15 MED ORDER — FENTANYL CITRATE (PF) 100 MCG/2ML IJ SOLN
INTRAMUSCULAR | Status: AC
  Filled 2016-09-15: qty 2

## 2016-09-15 MED ORDER — CHLORHEXIDINE GLUCONATE CLOTH 2 % EX PADS: 6.0000 | MEDICATED_PAD | Freq: Once | CUTANEOUS | Status: DC

## 2016-09-15 MED ORDER — HEPARIN SOD (PORK) LOCK FLUSH 100 UNIT/ML IV SOLN
INTRAVENOUS | Status: DC | PRN
  Administered 2016-09-15: 500 [IU] via INTRAVENOUS

## 2016-09-15 MED ORDER — LIDOCAINE HCL (PF) 1 % IJ SOLN
INTRAMUSCULAR | Status: AC
  Filled 2016-09-15: qty 30

## 2016-09-15 MED ORDER — HEPARIN SOD (PORK) LOCK FLUSH 100 UNIT/ML IV SOLN
INTRAVENOUS | Status: AC
  Filled 2016-09-15: qty 5

## 2016-09-15 MED ORDER — SODIUM CHLORIDE 0.9% FLUSH
10.0000 mL | INTRAVENOUS | Status: DC | PRN
  Administered 2016-09-15: 10 mL
  Filled 2016-09-15: qty 10

## 2016-09-15 MED ORDER — SEVOFLURANE IN SOLN
RESPIRATORY_TRACT | Status: AC
  Filled 2016-09-15: qty 250

## 2016-09-15 MED ORDER — PROPOFOL 10 MG/ML IV BOLUS
INTRAVENOUS | Status: AC
  Filled 2016-09-15: qty 20

## 2016-09-15 MED ORDER — MIDAZOLAM HCL 2 MG/2ML IJ SOLN
INTRAMUSCULAR | Status: AC
  Filled 2016-09-15: qty 2

## 2016-09-15 MED ORDER — VANCOMYCIN HCL IN DEXTROSE 1-5 GM/200ML-% IV SOLN
1000.0000 mg | INTRAVENOUS | Status: AC
  Administered 2016-09-15: 1000 mg via INTRAVENOUS
  Filled 2016-09-15: qty 200

## 2016-09-15 MED ORDER — SODIUM CHLORIDE 0.9 % IV SOLN
INTRAVENOUS | Status: DC | PRN
  Administered 2016-09-15: 50 mL via INTRAMUSCULAR

## 2016-09-15 MED ORDER — LACTATED RINGERS IV SOLN
INTRAVENOUS | Status: DC
  Administered 2016-09-15: 1000 mL via INTRAVENOUS

## 2016-09-15 MED ORDER — SODIUM CHLORIDE 0.9 % IV SOLN
Freq: Once | INTRAVENOUS | Status: AC
  Administered 2016-09-15: 13:00:00 via INTRAVENOUS

## 2016-09-15 MED ORDER — FENTANYL CITRATE (PF) 100 MCG/2ML IJ SOLN
25.0000 ug | INTRAMUSCULAR | Status: AC
  Administered 2016-09-15 (×2): 25 ug via INTRAVENOUS

## 2016-09-15 MED ORDER — PROPOFOL 500 MG/50ML IV EMUL
INTRAVENOUS | Status: DC | PRN
  Administered 2016-09-15: 100 ug/kg/min via INTRAVENOUS

## 2016-09-15 MED ORDER — SODIUM CHLORIDE 0.9 % IV SOLN
Freq: Once | INTRAVENOUS | Status: AC
  Administered 2016-09-15: 13:00:00 via INTRAVENOUS
  Filled 2016-09-15: qty 4

## 2016-09-15 MED ORDER — TRAMADOL HCL 50 MG PO TABS: 50.0000 mg | ORAL_TABLET | Freq: Two times a day (BID) | ORAL | 0 refills | Status: AC | PRN

## 2016-09-15 MED ORDER — PROPOFOL 10 MG/ML IV BOLUS
INTRAVENOUS | Status: DC | PRN
  Administered 2016-09-15 (×5): 10 mg via INTRAVENOUS

## 2016-09-15 SURGICAL SUPPLY — 38 items
APPLIER CLIP 9.375 SM OPEN (CLIP)
BAG DECANTER FOR FLEXI CONT (MISCELLANEOUS) ×3 IMPLANT
BAG HAMPER (MISCELLANEOUS) ×3 IMPLANT
CATH HICKMAN DUAL 12.0 (CATHETERS) IMPLANT
CHLORAPREP W/TINT 10.5 ML (MISCELLANEOUS) ×3 IMPLANT
CLIP APPLIE 9.375 SM OPEN (CLIP) IMPLANT
CLOTH BEACON ORANGE TIMEOUT ST (SAFETY) ×3 IMPLANT
COVER LIGHT HANDLE STERIS (MISCELLANEOUS) ×6 IMPLANT
DECANTER SPIKE VIAL GLASS SM (MISCELLANEOUS) ×3 IMPLANT
DERMABOND ADVANCED (GAUZE/BANDAGES/DRESSINGS) ×2
DERMABOND ADVANCED .7 DNX12 (GAUZE/BANDAGES/DRESSINGS) ×1 IMPLANT
DRAPE C-ARM FOLDED MOBILE STRL (DRAPES) ×3 IMPLANT
DRSG TEGADERM 4X4.75 (GAUZE/BANDAGES/DRESSINGS) ×3 IMPLANT
ELECT REM PT RETURN 9FT ADLT (ELECTROSURGICAL) ×3
ELECTRODE REM PT RTRN 9FT ADLT (ELECTROSURGICAL) ×1 IMPLANT
GLOVE BIOGEL PI IND STRL 7.0 (GLOVE) ×1 IMPLANT
GLOVE BIOGEL PI INDICATOR 7.0 (GLOVE) ×2
GLOVE SURG SS PI 7.5 STRL IVOR (GLOVE) ×3 IMPLANT
GOWN STRL REUS W/TWL LRG LVL3 (GOWN DISPOSABLE) ×6 IMPLANT
IV NS 500ML (IV SOLUTION) ×2
IV NS 500ML BAXH (IV SOLUTION) ×1 IMPLANT
KIT PORT POWER 8FR ISP MRI (Port) ×3 IMPLANT
KIT ROOM TURNOVER APOR (KITS) ×3 IMPLANT
MANIFOLD NEPTUNE II (INSTRUMENTS) ×3 IMPLANT
NEEDLE HYPO 25X1 1.5 SAFETY (NEEDLE) ×3 IMPLANT
PACK MINOR (CUSTOM PROCEDURE TRAY) ×3 IMPLANT
PAD ARMBOARD 7.5X6 YLW CONV (MISCELLANEOUS) ×3 IMPLANT
SET BASIN LINEN APH (SET/KITS/TRAYS/PACK) ×3 IMPLANT
SET INTRODUCER 12FR PACEMAKER (SHEATH) IMPLANT
SHEATH COOK PEEL AWAY SET 8F (SHEATH) IMPLANT
SPONGE GAUZE 2X2 8PLY STER LF (GAUZE/BANDAGES/DRESSINGS) ×1
SPONGE GAUZE 2X2 8PLY STRL LF (GAUZE/BANDAGES/DRESSINGS) ×2 IMPLANT
SUT PROLENE 3 0 PS 2 (SUTURE) IMPLANT
SUT VIC AB 3-0 SH 27 (SUTURE) ×2
SUT VIC AB 3-0 SH 27X BRD (SUTURE) ×1 IMPLANT
SUT VIC AB 4-0 PS2 27 (SUTURE) ×3 IMPLANT
SYR 20CC LL (SYRINGE) ×3 IMPLANT
SYR CONTROL 10ML LL (SYRINGE) ×3 IMPLANT

## 2016-09-15 NOTE — Anesthesia Preprocedure Evaluation (Signed)
Anesthesia Evaluation  Patient identified by MRN, date of birth, ID band Patient awake    Reviewed: Allergy & Precautions, NPO status , Patient's Chart, lab work & pertinent test results  History of Anesthesia Complications (+) history of anesthetic complications ( "BP dropped too low and had a sroke" 2014)  Airway Mallampati: II  TM Distance: >3 FB     Dental  (+) Teeth Intact   Pulmonary COPD, former smoker,  Lung Cancer    breath sounds clear to auscultation       Cardiovascular hypertension, Pt. on medications + CAD and + Peripheral Vascular Disease   Rhythm:Regular Rate:Normal     Neuro/Psych Anxiety CVA, No Residual Symptoms    GI/Hepatic hiatal hernia, GERD  Controlled and Medicated,  Endo/Other    Renal/GU      Musculoskeletal   Abdominal   Peds  Hematology   Anesthesia Other Findings   Reproductive/Obstetrics                             Anesthesia Physical Anesthesia Plan  ASA: III  Anesthesia Plan: MAC   Post-op Pain Management:    Induction: Intravenous  Airway Management Planned: Simple Face Mask  Additional Equipment:   Intra-op Plan:   Post-operative Plan:   Informed Consent: I have reviewed the patients History and Physical, chart, labs and discussed the procedure including the risks, benefits and alternatives for the proposed anesthesia with the patient or authorized representative who has indicated his/her understanding and acceptance.     Plan Discussed with:   Anesthesia Plan Comments:         Anesthesia Quick Evaluation

## 2016-09-15 NOTE — Anesthesia Postprocedure Evaluation (Signed)
Anesthesia Post Note  Patient: Holly Knight  Procedure(s) Performed: Procedure(s) (LRB): INSERTION PORT-A-CATH (N/A)  Patient location during evaluation: PACU Anesthesia Type: MAC Level of consciousness: awake and alert and oriented Pain management: pain level controlled Vital Signs Assessment: post-procedure vital signs reviewed and stable Postop Assessment: adequate PO intake and no signs of nausea or vomiting Anesthetic complications: no     Last Vitals:  Vitals:   09/15/16 0915 09/15/16 0930  BP: 108/69 103/68  Resp: (!) 41 (!) 22  Temp:      Last Pain:  Vitals:   09/15/16 0819  TempSrc: Oral                 Lita Flynn

## 2016-09-15 NOTE — Op Note (Signed)
Patient:  Holly Knight  DOB:  Jan 23, 1961  MRN:  [id number]   Preop Diagnosis:  Lung carcinoma, need for central venous access  Postop Diagnosis:  Same  Procedure:  Port-A-Cath insertion  Surgeon:  Aviva Signs, M.D.  Anes:  Mac  Indications:  Patient is a 56 year old black female who presents for Port-A-Cath insertion. She is about to undergo chemotherapy for lung carcinoma. The risks and benefits of the procedure including bleeding, infection, and pneumothorax were fully explained to the patient, who gave informed consent.   Procedure note:  Patient was placed in the supine position after the right upper chest was prepped and draped using the usual sterile technique with DuraPrep. Surgical site confirmation was performed.  One percent Xylocaine was used for local anesthesia.  An incision was made below the right clavicle. A subcutaneous pocket was formed. The needles advanced into the right subclavian vein using the Seldinger technique without difficulty. A guidewire was advanced into the right atrium under fluoroscopic guidance. An introducer and peel-away sheath were placed over the guidewire.  The catheter was then inserted through the peel-away sheath and the peel-away sheath was removed. The catheter was then attached to the port and the port placed in subcutaneous pocket. Adequate positioning was confirmed by fluoroscopy. Good backflow of blood was noted on aspiration of the port. The port was flushed with heparin flush and left accessed. The subcutaneous layer was reapproximated using a 3-0 Vicryl interrupted suture. The skin was closed using a 4-0 Vicryl subcuticular suture. Dermabond was applied.  All tape and needle counts were correct at the end of the procedure. The patient was awakened and transferred to PACU in stable condition.   Complications:  None  EBL:  Minimal  Specimen:  None

## 2016-09-15 NOTE — Patient Instructions (Signed)
[facility] Discharge Instructions for Patients Receiving Chemotherapy   Beginning January 23rd 2017 lab work for the [facility] will be done in the  Main lab at [facility] on 1st floor. If you have a lab appointment with the [facility] please come in thru the  Main Entrance and check in at the main information desk   Today you received the following chemotherapy agents VP-16. Follow-up as scheduled. Call clinic for any questions or concerns  To help prevent nausea and vomiting after your treatment, we encourage you to take your nausea medication   If you develop nausea and vomiting, or diarrhea that is not controlled by your medication, call the clinic.  The clinic phone number is [phone]. Office hours are Monday-Friday 8:30am-5:00pm.  BELOW ARE SYMPTOMS THAT SHOULD BE REPORTED IMMEDIATELY:  *FEVER GREATER THAN 101.0 F  *CHILLS WITH OR WITHOUT FEVER  NAUSEA AND VOMITING THAT IS NOT CONTROLLED WITH YOUR NAUSEA MEDICATION  *UNUSUAL SHORTNESS OF BREATH  *UNUSUAL BRUISING OR BLEEDING  TENDERNESS IN MOUTH AND THROAT WITH OR WITHOUT PRESENCE OF ULCERS  *URINARY PROBLEMS  *BOWEL PROBLEMS  UNUSUAL RASH Items with * indicate a potential emergency and should be followed up as soon as possible. If you have an emergency after office hours please contact your primary care physician or go to the nearest emergency department.  Please call the clinic during office hours if you have any questions or concerns.   You may also contact the Patient Navigator at [phone] should you have any questions or need assistance in obtaining follow up care.      Resources For Cancer Patients and their Caregivers ? American Cancer Society: Can assist with transportation, wigs, general needs, runs Look Good Feel Better.        954-329-1305 ? Cancer Care: Provides financial assistance, online support groups, medication/co-pay assistance.  1-800-813-HOPE  6301042618) ? [place] Assists [place] cancer patients and their families through emotional , educational and financial support.  (859)737-5834 ? [place] DSS Where to apply for food stamps, Medicaid and utility assistance. 206-484-3912 ? [facility]: Transportation to medical appointments. (404)452-6685 ? Social Security Administration: May apply for disability if have a Stage IV cancer. ([phone] 980-663-2234 ? [employer], Disability and Transit Services: Assists with nutrition, care and transit needs. 434-798-4547

## 2016-09-15 NOTE — Progress Notes (Signed)
Transferred to [place] via w/c for chemo tx.

## 2016-09-15 NOTE — Progress Notes (Signed)
[place] [address], [place] [address]   CLINIC:  Medical Oncology/Hematology  PCP:  Andres Shad, MD [address] 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   CURRENT THERAPY: Cisplatin/Etoposide, beginning on 08/25/16   BRIEF ONCOLOGIC HISTORY:    Small cell lung cancer ([place])   05/21/2016 Imaging    CT chest wo contrast in [place], [place]-increased size of the nodule in the right lung apex.  The postsurgical changes, scarring and interstitial fibrotic changes in the right lung are otherwise stable.  Repeat surveillance evaluation of the right lung in 3-4 months with CT chest is recommended.  Stable prominent mediastinal lymph nodes.  No new adenopathy within the mediastinum.  There are stable chronic changes within the abdomen.      06/11/2016 PET scan    PET in [place], [place]- Slightly spiculated nodule within the posterior aspect of the right lung apex described on previous study is FDG avid with a maximal SUV of 6.43.  There has also been interval development of FDG avid mass in the left hilar region when compared to prior study.  The nodule within the apex of the right upper lobe is FDG avid and has increased in size described on prior CTs.  These findings are concerning for recurrent disease.  FDG avid nodules within the left hilar region likely representing lymph nodes.  Differential considerations are metastatic lymph nodes versus active lymph nodes from infection or inflammatory changes.      07/29/2016 Pathology Results    Left hilar lymph node (level 10L)- positive for neoplasia.  A small panel of immunostains is performed to confirm the diagnosis.  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      08/19/2016  Imaging    MRI brain in [place], [place]-postcontrast images shows 2 rounded enhancing foci in the posterior aspect of the left occipital lobe measures 6 mm and 4 mm in size.  These findings are highly suspicious for metastatic lesions.  Old right parietal lobe infarct.  Mucosal thickening in the right maxillary antrum consistent with chronic sinusitis.  Debris is noted within the sinus.  Mucosal thickening is also noted in the left maxillary antrum and ethmoid sinuses.      08/20/2016 PET scan    PET in [place], [place]- stable 1.2 cm nodule in the right lung apex which demonstrates moderate to intense FDG avidity, interval increase in size of left hilar lymphadenopathy with intense FDG avidity, and stable 9 mm minimally to mildly FDG avid pulmonary nodule in the left lower lobe.  No new sites of disease within the abdomen or pelvis or osseous structures.       08/25/2016 -  Chemotherapy    The patient had palonosetron (ALOXI) injection 0.25 mg, 0.25 mg, Intravenous,  Once, 2 of 4 cycles  pegfilgrastim (NEULASTA ONPRO KIT) injection 6 mg, 6 mg, Subcutaneous, Once, 2 of 4 cycles  CISplatin (PLATINOL) 100 mg in sodium chloride 0.9 % 250 mL chemo infusion, 60 mg/m2 = 100 mg, Intravenous,  Once, 2 of 4 cycles  etoposide (VEPESID) 200 mg in sodium chloride 0.9 % 500 mL chemo infusion, 120 mg/m2 = 200 mg, Intravenous,  Once, 2 of 4 cycles Administration: 200 mg (09/15/2016)  ondansetron (ZOFRAN) 8 mg, dexamethasone (DECADRON) 10  mg in sodium chloride 0.9 % 50 mL IVPB, , Intravenous,  Once, 1 of 1 cycle Administration:  (09/15/2016)  fosaprepitant (EMEND) 150 mg, dexamethasone (DECADRON) 12 mg in sodium chloride 0.9 % 145 mL IVPB, , Intravenous,  Once, 2 of 4 cycles  for chemotherapy treatment.        09/15/2016 Procedure    Port-a-cath placement (Dr. Arnoldo Morale)        Squamous cell carcinoma of right lung ([place])   01/21/2013 PET scan    PET scan and [place], [place]-hypermetabolic activity corresponding to  the right upper lobe malignancy, SUV 8.3.  Also linear increased uptake identified within the anterior and left lateral aspect of the base of tongue maximum SUV of 15.  No evidence of any other hypermetabolic activity identified.  The area of increased uptake at the left lateral base of tongue was stated to potentially represent inflammation, but direct visualization was recommended.      02/08/2013 Miscellaneous    Approximate date.  Patient underwent direct laryngoscopy by ENT no lesion identified in her oropharynx.      03/13/2013 Procedure    Right upper lobectomy with small wedge of the right lower lobe as well as lymph node dissection with right tracheobronchial level 7 subcarinal and right lower paratracheal 4R and azygous lymph node biopsies.  All lymph node samples were negative for tumor.  Patient noted to have squamous cell carcinoma of right upper lobe measuring 2 cm.  Surgical margins were free of tumor.  No alvei was identified.  Tumor did extend to the visceropleural but did not invade.  Patient's ultimate staging was pT1 pN0 M0      04/07/2013 Pathology Results    Pathology from right upper lobectomy with small wedge of the right lower lobe as well as lymph node dissection with right tracheobronchial level 7 subcarinal and right lower paratracheal 4R and azygous lymph node biopsies.  All lymph node samples were negative for tumor.  Patient noted to have squamous cell carcinoma of right upper lobe measuring 2 cm.  Surgical margins were free of tumor.  No alvei was identified.  Tumor did extend to the visceropleural but did not invade.  Patient's ultimate staging was pT1 pN0 M0        HISTORY OF PRESENT ILLNESS:  (From Dr. Laverle Patter last note on 09/04/16)       INTERVAL HISTORY:  Holly Knight 56 y.o. female returns for follow-up and for consideration for cycle #2 chemotherapy with Cisplatin/Etoposide.    She had port-a-cath placement this morning with Dr. Arnoldo Morale; procedure went  well.  She is concerned she will have pain later this evening.  She also is concerned about her chronic back pain, which she has been managing with OTC Goody powders.  She was told to stop taking the Goody powders d/t risk for bleeding.  She previously took Tramadol 50 mg PRN for pain, which was effective. She is requesting a refill today. She has received refills for the Baclofen and Flexeril recently for back spasms from [facility].    Her appetite is poor. She has lost about 6 pounds in the last 11 days. She reports that her appetite is poor; she is hoping to be able to supplement her intake with nutritional supplements like Boost/Ensure. She also would like to meet with the dietitian.    Otherwise, she "feels pretty good."  She feels like she is tolerating chemotherapy better "this time around than I did the first time."  REVIEW OF SYSTEMS:  Review of Systems  Constitutional: Positive for appetite change and fatigue. Negative for chills and fever.  HENT:  Negative.  Negative for mouth sores.   Eyes: Negative.   Cardiovascular: Negative.  Negative for chest pain and leg swelling.  Gastrointestinal: Negative for abdominal pain, constipation, diarrhea, nausea and vomiting.  Endocrine: Negative.   Genitourinary: Negative.  Negative for dysuria and hematuria.   Musculoskeletal: Positive for back pain.  Skin: Negative.  Negative for rash.  Hematological: Negative.   Psychiatric/Behavioral: Negative.      PAST MEDICAL/SURGICAL HISTORY:  Past Medical History:  Diagnosis Date  . Anxiety   . BPPV (benign paroxysmal positional vertigo)    takes valium  . Cancer ([place]) 01/2013  . Complication of anesthesia 02/10/2013   BP dropped too low and had a sroke  . COPD (chronic obstructive pulmonary disease) ([place])   . Coronary artery disease   . H/O hiatal hernia   . Hypertension   . Lung cancer ([place])   . Peripheral vascular disease ([facility])    PAD-   . Pulmonary embolism ([place]) 1985ish     due to birth control pills  . Small cell lung cancer ([place]) 08/21/2016  . Squamous cell carcinoma of right lung ([place]) 08/21/2016  . Stroke [facility]) 2010 2014   left hand weakness - PT    Past Surgical History:  Procedure Laterality Date  . BACK SURGERY    . CERVICAL SPINE SURGERY    . CORONARY ANGIOPLASTY  12/2011    stent  . CYST EXCISION Right 1989   breast  . SALIVARY STONE REMOVAL N/A 04/13/2013   Procedure: INTRAORAL EXCISION SUBMANDIBULAR STONES ;  Surgeon: Izora Gala, MD;  Location: [facility];  Service: ENT;  Laterality: N/A;  . TUMOR REMOVAL Right 02/10/2013   lung  . UTERINE FIBROID EMBOLIZATION  2005  . UTERINE FIBROID SURGERY     x2     SOCIAL HISTORY:  Social History   Social History  . Marital status: Divorced    Spouse name: N/A  . Number of children: N/A  . Years of education: N/A   Occupational History  . Not on file.   Social History Main Topics  . Smoking status: Former Smoker    Years: 35.00    Types: Cigarettes    Quit date: 02/10/2013  . Smokeless tobacco: Never Used  . Alcohol use No     Comment: 2- 3 a month  . Drug use: No  . Sexual activity: Not Currently   Other Topics Concern  . Not on file   Social History Narrative  . No narrative on file    FAMILY HISTORY:  Family History  Problem Relation Age of Onset  . Cancer Mother     breast  . Seizures Sister     CURRENT MEDICATIONS:  Facility-Administered Encounter Medications as of 09/15/2016  Medication  . [COMPLETED] 0.9 %  sodium chloride infusion  . Chlorhexidine Gluconate Cloth 2 % PADS 6 each   And  . Chlorhexidine Gluconate Cloth 2 % PADS 6 each  . [COMPLETED] etoposide (VEPESID) 200 mg in sodium chloride 0.9 % 500 mL chemo infusion  . [COMPLETED] heparin lock flush 100 unit/mL  . lactated ringers infusion  . [COMPLETED] ondansetron (ZOFRAN) 8 mg, dexamethasone (DECADRON) 10 mg in sodium chloride 0.9 % 50 mL IVPB  . sodium chloride flush (NS) 0.9 % injection 10 mL  . heparin  lock flush 100 UNIT/ML injection   Outpatient Encounter Prescriptions  as of 09/15/2016  Medication Sig Note  . amLODipine-benazepril (LOTREL) 5-20 MG per capsule Take 1 capsule by mouth 2 (two) times daily.   Marland Kitchen aspirin EC 81 MG tablet Take 81 mg by mouth daily.   Marland Kitchen atorvastatin (LIPITOR) 20 MG tablet Take 20 mg by mouth at bedtime.   Marland Kitchen azelastine (ASTELIN) 0.1 % nasal spray Place 1-2 sprays into both nostrils 2 (two) times daily as needed (for allergies/nasal congestion.).    Marland Kitchen baclofen (LIORESAL) 10 MG tablet Take 1 tablet (10 mg total) by mouth 2 (two) times daily.   . budesonide-formoterol (SYMBICORT) 160-4.5 MCG/ACT inhaler Inhale 2 puffs twice daily   . chlorpheniramine-HYDROcodone (TUSSIONEX PENNKINETIC ER) 10-8 MG/5ML SUER Take 5 mLs by mouth every 12 (twelve) hours as needed for cough.   . Cholecalciferol (VITAMIN D3) 2000 units capsule Take 2,000 Units by mouth daily.    Marland Kitchen CISPLATIN IV Inject 100 mg into the vein once.    . cyclobenzaprine (FLEXERIL) 10 MG tablet Take 1 tablet (10 mg total) by mouth 3 (three) times daily as needed for muscle spasms.   Marland Kitchen dexamethasone (DECADRON) 4 MG tablet Take 2 tablets two times a day for 1 day on day 4 after cisplatin chemotherapy. Take with food.   . diazepam (VALIUM) 2 MG tablet Take 2 mg by mouth at bedtime.    . ETOPOSIDE IV Inject 200 mg into the vein once.    . fluticasone (FLONASE) 50 MCG/ACT nasal spray Place 1-2 sprays into both nostrils daily as needed (for allergies/nasal congestionl).    . folic acid (FOLVITE) 1 MG tablet TAKE 2 TABLETS (2 MG) BY MOUTH TWICE DAILY   . hydrALAZINE (APRESOLINE) 50 MG tablet Take 50 mg by mouth 2 (two) times daily.   Marland Kitchen ipratropium-albuterol (DUONEB) 0.5-2.5 (3) MG/3ML SOLN Inhale 3 mLs into the lungs every 6 (six) hours as needed (for wheezing/shortness of breath.).    Marland Kitchen l-methylfolate-B6-B12 (METANX) 3-35-2 MG TABS tablet Take 1 tablet by mouth 2 (two) times daily.    Marland Kitchen levofloxacin (LEVAQUIN) 500 MG tablet  Take 1 tablet (500 mg total) by mouth daily. (Patient not taking: Reported on 08/28/2016)   . lidocaine-prilocaine (EMLA) cream Apply to affected area once (Patient taking differently: Apply 1 application topically daily as needed (prior to port being access). ) 08/28/2016: Not started yet  . LORazepam (ATIVAN) 0.5 MG tablet Take 1 tablet (0.5 mg total) by mouth every 6 (six) hours as needed (Nausea or vomiting).   . mirtazapine (REMERON) 30 MG tablet Take 15 mg by mouth at bedtime.   . Omega-3 Fatty Acids (OMEGA 3 500 PO) Take 500 mg by mouth 2 (two) times daily.   Marland Kitchen omeprazole (PRILOSEC) 20 MG capsule Take 20 mg by mouth daily as needed (for acid reflux/gerd.).    Marland Kitchen ondansetron (ZOFRAN) 8 MG tablet Take 1 tablet (8 mg total) by mouth 2 (two) times daily as needed. Start on the third day after cisplatin chemotherapy.   . Pegfilgrastim (NEULASTA ONPRO [place]) Inject 6 mg into the skin once.    Marland Kitchen PROAIR HFA 108 (90 Base) MCG/ACT inhaler Inhale 1-2 puffs into the lungs every 6 (six) hours as needed for wheezing or shortness of breath.   . prochlorperazine (COMPAZINE) 10 MG tablet Take 1 tablet (10 mg total) by mouth every 6 (six) hours as needed (Nausea or vomiting).   Marland Kitchen tiotropium (SPIRIVA HANDIHALER) 18 MCG inhalation capsule Inhale 1 capsule (18 MCG) daily as needed for respiratory issues.   Marland Kitchen  traMADol (ULTRAM) 50 MG tablet Take 1 tablet (50 mg total) by mouth 2 (two) times daily as needed.   . [DISCONTINUED] Aspirin-Acetaminophen-Caffeine (GOODYS EXTRA STRENGTH) 520-260-32.5 MG PACK Take 1 packet by mouth every 8 (eight) hours as needed (for pain/headache.).   . [DISCONTINUED] traMADol (ULTRAM) 50 MG tablet Take 50 mg by mouth 4 (four) times daily as needed. For pain.     ALLERGIES:  Allergies  Allergen Reactions  . Amoxicillin Anaphylaxis    Has patient had a PCN reaction causing immediate rash, facial/tongue/throat swelling, SOB or lightheadedness with hypotension:Yes Has patient had a PCN reaction  causing severe rash involving mucus membranes or skin necrosis:No Has patient had a PCN reaction that required hospitalization:Treated in ER Has patient had a PCN reaction occurring within the last 10 years:No If all of the above answers are "NO", then may proceed with Cephalosporin use.    Marland Kitchen Penicillins Anaphylaxis    Has patient had a PCN reaction causing immediate rash, facial/tongue/throat swelling, SOB or lightheadedness with hypotension:Yes Has patient had a PCN reaction causing severe rash involving mucus membranes or skin necrosis:No Has patient had a PCN reaction that required hospitalization:Treated in ER Has patient had a PCN reaction occurring within the last 10 years:No If all of the above answers are "NO", then may proceed with Cephalosporin use.   . Sulfa Antibiotics Rash  . Beta Adrenergic Blockers Other (See Comments)    Caused Hypotension  . Pecan Extract Allergy Skin Test Other (See Comments)    Flu-like symptoms.     PHYSICAL EXAM:  ECOG Performance status: 1-2 - Symptomatic; requires occasional assistance.      Physical Exam  Constitutional: She is oriented to person, place, and time and well-developed, well-nourished, and in no distress.  HENT:  Head: Normocephalic.  Mouth/Throat: Oropharynx is clear and moist.  Eyes: Conjunctivae are normal. No scleral icterus.  Neck: Normal range of motion. Neck supple.  Cardiovascular: Normal rate, regular rhythm and normal heart sounds.   Pulmonary/Chest: Effort normal. No respiratory distress. She has no wheezes.  Diminished breath sounds to bilateral upper lobes.  Abdominal: Soft. Bowel sounds are normal. There is no tenderness.  Musculoskeletal: Normal range of motion. She exhibits no edema.  Lymphadenopathy:    She has no cervical adenopathy.       Right: No supraclavicular adenopathy present.       Left: No supraclavicular adenopathy present.  Neurological: She is alert and oriented to person, place, and time.  No cranial nerve deficit.  Skin: Skin is warm and dry. No rash noted.  Psychiatric: Mood, memory, affect and judgment normal.  Nursing note and vitals reviewed.    LABORATORY DATA:  I have reviewed the labs as listed.  CBC    Component Value Date/Time   WBC 7.7 09/15/2016 1130   RBC 3.69 (L) 09/15/2016 1130   HGB 11.1 (L) 09/15/2016 1130   HCT 32.5 (L) 09/15/2016 1130   PLT 474 (H) 09/15/2016 1130   MCV 88.1 09/15/2016 1130   MCH 30.1 09/15/2016 1130   MCHC 34.2 09/15/2016 1130   RDW 14.3 09/15/2016 1130   LYMPHSABS 3.0 09/15/2016 1130   MONOABS 0.7 09/15/2016 1130   EOSABS 0.0 09/15/2016 1130   BASOSABS 0.0 09/15/2016 1130   CMP Latest Ref Rng & Units 09/15/2016 09/04/2016 08/21/2016  Glucose 65 - 99 mg/dL 102(H) 86 108(H)  BUN 6 - 20 mg/dL 9 11 10   Creatinine 0.44 - 1.00 mg/dL 0.61 0.57 0.73  Sodium 135 - 145  mmol/L 134(L) 137 135  Potassium 3.5 - 5.1 mmol/L 4.2 4.2 4.0  Chloride 101 - 111 mmol/L 103 103 101  CO2 22 - 32 mmol/L 23 26 24   Calcium 8.9 - 10.3 mg/dL 10.5(H) 9.9 10.9(H)  Total Protein 6.5 - 8.1 g/dL 7.1 7.2 7.9  Total Bilirubin 0.3 - 1.2 mg/dL 0.2(L) 0.4 0.5  Alkaline Phos 38 - 126 U/L 86 71 93  AST 15 - 41 U/L 23 16 21   ALT 14 - 54 U/L 22 18 18     PENDING LABS:    DIAGNOSTIC IMAGING:  MRI Brain: 08/19/16     PET scan: 08/20/16     PATHOLOGY:  (L) hilar lymph node biopsy: 07/29/16       ASSESSMENT & PLAN:   Extensive stage 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  Due for cycle #2 today.  -Office visit consultation note dated 09/05/16 from Dr. Gwendlyn Deutscher, Radiation Oncologist at [facility] reviewed today.  His recommendation is to proceed with either radiation therapy to the chest for local control in the short-term (to about 35 Gy) with concurrent chemotherapy vs proceed with chemotherapy alone and reserve palliative radiation therapy in event of local recurrence.  She could of course proceed with brain radiation to treat brain mets in the near future as well.  Discussions with Dr. Talbert Cage and Dr. Rhunette Croft are forthcoming to solidify patient's plan of care.  -Labs reviewed and are adequate for treatment today.  Based on timing of patient's scheduled chemotherapy, she will receive Etoposide only today (day 2 chemotherapy); she will receive day 1 chemotherapy tomorrow with Cisplatin/Etoposide.  -Return to cancer center before cycle #3 chemotherapy.   Pain:  -Chronic back pain.  -[place] Controlled Substance Reporting Registry reviewed. Refill of Tramadol appropriate; Paper prescription given to patient today for Tramadol 50 mg po BIDprn, #60, no refills.     Constipation prevention:  -Reinforced the importance of constipation prevention with the use of adequate bowel regimen. She has historically used OTC magnesium supplement to help her have BM; recommended she use Miralax instead of oral magnesium supplement, so as now to increase electrolyte imbalances with chemotherapy.   Weight loss/Decreased appetite:  -Noted ~6 lb weight loss in the past 11 days. Reports she has little appetite and does not find the food appealing in her current living situation.   -She is interested in trying nutritional supplements like Boost/Ensure or generic protein supplements.  -She understands that she needs increased calories and protein while undergoing cancer treatments.  -Referral placed to dietitian for further evaluation and recommendation.     Dispo:  -Return to cancer center as directed for remainder of current cycle of chemotherapy.  -Return for follow-up prior to next cycle chemotherapy.    All questions were answered to patient's stated satisfaction. Encouraged patient to call with any new concerns or questions before her next visit to the cancer center and we can certain see her sooner, if needed.      Orders placed this encounter:  Orders Placed This  Encounter  Procedures  . Amb Referral to Nutrition and Diabetic E      Mike Craze, NP [place] [phone]

## 2016-09-15 NOTE — Interval H&P Note (Signed)
History and Physical Interval Note:  09/15/2016 9:19 AM  Holly Knight  has presented today for surgery, with the diagnosis of lung cancer  The various methods of treatment have been discussed with the patient and family. After consideration of risks, benefits and other options for treatment, the patient has consented to  Procedure(s): INSERTION PORT-A-CATH (N/A) as a surgical intervention .  The patient's history has been reviewed, patient examined, no change in status, stable for surgery.  I have reviewed the patient's chart and labs.  Questions were answered to the patient's satisfaction.     [facility] Signs

## 2016-09-15 NOTE — Patient Instructions (Signed)
[place] at [facility] Discharge Instructions  RECOMMENDATIONS MADE BY THE CONSULTANT AND ANY TEST RESULTS WILL BE SENT TO YOUR REFERRING PHYSICIAN.  You were seen today by Mike Craze NP. Return for follow up when you start cycle 3.   Thank you for choosing [place] at [facility] to provide your oncology and hematology care.  To afford each patient quality time with our provider, please arrive at least 15 minutes before your scheduled appointment time.    If you have a lab appointment with the [facility] please come in thru the  Main Entrance and check in at the main information desk  You need to re-schedule your appointment should you arrive 10 or more minutes late.  We strive to give you quality time with our providers, and arriving late affects you and other patients whose appointments are after yours.  Also, if you no show three or more times for appointments you may be dismissed from the clinic at the providers discretion.     Again, thank you for choosing [facility].  Our hope is that these requests will decrease the amount of time that you wait before being seen by our physicians.       _____________________________________________________________  Should you have questions after your visit to [facility], please contact our office at [phone] between the hours of 8:30 a.m. and 4:30 p.m.  Voicemails left after 4:30 p.m. will not be returned until the following business day.  For prescription refill requests, have your pharmacy contact our office.       Resources For Cancer Patients and their Caregivers ? American Cancer Society: Can assist with transportation, wigs, general needs, runs Look Good Feel Better.        ([phone] ? Cancer Care: Provides financial assistance, online support groups, medication/co-pay assistance.  1-800-813-HOPE 317-258-7248) ? [facility] Assists [place] cancer patients and their families through emotional , educational and financial support.  507-515-7448 ? [place] DSS Where to apply for food stamps, Medicaid and utility assistance. 563 674 9950 ? RCATS: Transportation to medical appointments. 769-427-4097 ? Social Security Administration: May apply for disability if have a Stage IV cancer. 458-372-2090 817-247-1776 ? [employer], Disability and Transit Services: Assists with nutrition, care and transit needs. [place] Support Programs: '@10RELATIVEDAYS'$ @ > Cancer Support Group  2nd Tuesday of the month 1pm-2pm, Journey Room  > Creative Journey  3rd Tuesday of the month 1130am-1pm, Journey Room  > Look Good Feel Better  1st Wednesday of the month 10am-12 noon, Journey Room (Call [place] to register [phone])

## 2016-09-15 NOTE — Transfer of Care (Signed)
Immediate Anesthesia Transfer of Care Note  Patient: Holly Knight  Procedure(s) Performed: Procedure(s): INSERTION PORT-A-CATH (N/A)  Patient Location: PACU  Anesthesia Type:MAC  Level of Consciousness: awake and alert   Airway & Oxygen Therapy: Patient Spontanous Breathing and Patient connected to nasal cannula oxygen  Post-op Assessment: Report given to RN  Post vital signs: Reviewed and stable  Last Vitals:  Vitals:   09/15/16 0915 09/15/16 0930  BP: 108/69 103/68  Resp: (!) 41 (!) 22  Temp:      Last Pain:  Vitals:   09/15/16 0819  TempSrc: Oral      Patients Stated Pain Goal: 7 (35/45/62 [id number])  Complications: No apparent anesthesia complications

## 2016-09-15 NOTE — Discharge Instructions (Addendum)
Implanted Port Insertion, Care After °This sheet gives you information about how to care for yourself after your procedure. Your health care provider may also give you more specific instructions. If you have problems or questions, contact your health care provider. °What can I expect after the procedure? °After your procedure, it is common to have: °· Discomfort at the port insertion site. °· Bruising on the skin over the port. This should improve over 3-4 days. ° °Follow these instructions at home: °Port care °· After your port is placed, you will get a manufacturer's information card. The card has information about your port. Keep this card with you at all times. °· Take care of the port as told by your health care provider. Ask your health care provider if you or a family member can get training for taking care of the port at home. A home health care nurse may also take care of the port. °· Make sure to remember what type of port you have. °Incision care °· Follow instructions from your health care provider about how to take care of your port insertion site. Make sure you: °? Wash your hands with soap and water before you change your bandage (dressing). If soap and water are not available, use hand sanitizer. °? Change your dressing as told by your health care provider. °? Leave stitches (sutures), skin glue, or adhesive strips in place. These skin closures may need to stay in place for 2 weeks or longer. If adhesive strip edges start to loosen and curl up, you may trim the loose edges. Do not remove adhesive strips completely unless your health care provider tells you to do that. °· Check your port insertion site every day for signs of infection. Check for: °? More redness, swelling, or pain. °? More fluid or blood. °? Warmth. °? Pus or a bad smell. °General instructions °· Do not take baths, swim, or use a hot tub until your health care provider approves. °· Do not lift anything that is heavier than 10 lb (4.5  kg) for a week, or as told by your health care provider. °· Ask your health care provider when it is okay to: °? Return to work or school. °? Resume usual physical activities or sports. °· Do not drive for 24 hours if you were given a medicine to help you relax (sedative). °· Take over-the-counter and prescription medicines only as told by your health care provider. °· Wear a medical alert bracelet in case of an emergency. This will tell any health care providers that you have a port. °· Keep all follow-up visits as told by your health care provider. This is important. °Contact a health care provider if: °· You cannot flush your port with saline as directed, or you cannot draw blood from the port. °· You have a fever or chills. °· You have more redness, swelling, or pain around your port insertion site. °· You have more fluid or blood coming from your port insertion site. °· Your port insertion site feels warm to the touch. °· You have pus or a bad smell coming from the port insertion site. °Get help right away if: °· You have chest pain or shortness of breath. °· You have bleeding from your port that you cannot control. °Summary °· Take care of the port as told by your health care provider. °· Change your dressing as told by your health care provider. °· Keep all follow-up visits as told by your health care provider. °  This information is not intended to replace advice given to you by your health care provider. Make sure you discuss any questions you have with your health care provider. °Document Released: 04/06/2013 Document Revised: 05/07/2016 Document Reviewed: 05/07/2016 °Elsevier Interactive Patient Education © 2017 Elsevier Inc. °Implanted Port Home Guide °An implanted port is a type of central line that is placed under the skin. Central lines are used to provide IV access when treatment or nutrition needs to be given through a person’s veins. Implanted ports are used for long-term IV access. An implanted port  may be placed because: °· You need IV medicine that would be irritating to the small veins in your hands or arms. °· You need long-term IV medicines, such as antibiotics. °· You need IV nutrition for a long period. °· You need frequent blood draws for lab tests. °· You need dialysis. ° °Implanted ports are usually placed in the chest area, but they can also be placed in the upper arm, the abdomen, or the leg. An implanted port has two main parts: °· Reservoir. The reservoir is round and will appear as a small, raised area under your skin. The reservoir is the part where a needle is inserted to give medicines or draw blood. °· Catheter. The catheter is a thin, flexible tube that extends from the reservoir. The catheter is placed into a large vein. Medicine that is inserted into the reservoir goes into the catheter and then into the vein. ° °How will I care for my incision site? °Do not get the incision site wet. Bathe or shower as directed by your health care provider. °How is my port accessed? °Special steps must be taken to access the port: °· Before the port is accessed, a numbing cream can be placed on the skin. This helps numb the skin over the port site. °· Your health care provider uses a sterile technique to access the port. °? Your health care provider must put on a mask and sterile gloves. °? The skin over your port is cleaned carefully with an antiseptic and allowed to dry. °? The port is gently pinched between sterile gloves, and a needle is inserted into the port. °· Only "non-coring" port needles should be used to access the port. Once the port is accessed, a blood return should be checked. This helps ensure that the port is in the vein and is not clogged. °· If your port needs to remain accessed for a constant infusion, a clear (transparent) bandage will be placed over the needle site. The bandage and needle will need to be changed every week, or as directed by your health care provider. °· Keep the  bandage covering the needle clean and dry. Do not get it wet. Follow your health care provider’s instructions on how to take a shower or bath while the port is accessed. °· If your port does not need to stay accessed, no bandage is needed over the port. ° °What is flushing? °Flushing helps keep the port from getting clogged. Follow your health care provider’s instructions on how and when to flush the port. Ports are usually flushed with saline solution or a medicine called heparin. The need for flushing will depend on how the port is used. °· If the port is used for intermittent medicines or blood draws, the port will need to be flushed: °? After medicines have been given. °? After blood has been drawn. °? As part of routine maintenance. °· If a constant infusion is   running, the port may not need to be flushed. ° °How long will my port stay implanted? °The port can stay in for as long as your health care provider thinks it is needed. When it is time for the port to come out, surgery will be done to remove it. The procedure is similar to the one performed when the port was put in. °When should I seek immediate medical care? °When you have an implanted port, you should seek immediate medical care if: °· You notice a bad smell coming from the incision site. °· You have swelling, redness, or drainage at the incision site. °· You have more swelling or pain at the port site or the surrounding area. °· You have a fever that is not controlled with medicine. ° °This information is not intended to replace advice given to you by your health care provider. Make sure you discuss any questions you have with your health care provider. °Document Released: 06/16/2005 Document Revised: 11/22/2015 Document Reviewed: 02/21/2013 °Elsevier Interactive Patient Education © 2017 Elsevier Inc. ° °

## 2016-09-15 NOTE — Progress Notes (Signed)
Holly Knight tolerated chemo tx well without complaints or incident. Port placed in same day surgery this morning and left accessed with dressing dry and intact. Port with good blood return and flushed easily. CXR reviewed prior to using port. Labs reviewed with Dr. Irene Limbo prior to administering chemotherapy. Port left accessed after being flushed per protocol for use tomorrow. VSS upon completion of chemotherapy. Pt to see Mike Craze NP prior to discharge.Pt discharged via wheelchair in satisfactory condition

## 2016-09-16 ENCOUNTER — Ambulatory Visit (HOSPITAL_COMMUNITY): Payer: Federal, State, Local not specified - PPO | Attending: Hematology

## 2016-09-16 ENCOUNTER — Encounter: Payer: Self-pay | Admitting: Dietician

## 2016-09-16 ENCOUNTER — Encounter (HOSPITAL_COMMUNITY): Payer: Self-pay | Admitting: General Surgery

## 2016-09-16 ENCOUNTER — Encounter (HOSPITAL_COMMUNITY): Payer: Federal, State, Local not specified - PPO | Admitting: Dietician

## 2016-09-16 VITALS — BP 123/63 | HR 95 | Temp 98.9°F | Resp 20

## 2016-09-16 DIAGNOSIS — C3432 Malignant neoplasm of lower lobe, left bronchus or lung: Secondary | ICD-10-CM | POA: Diagnosis not present

## 2016-09-16 DIAGNOSIS — C771 Secondary and unspecified malignant neoplasm of intrathoracic lymph nodes: Secondary | ICD-10-CM

## 2016-09-16 DIAGNOSIS — Z5111 Encounter for antineoplastic chemotherapy: Secondary | ICD-10-CM

## 2016-09-16 DIAGNOSIS — Z452 Encounter for adjustment and management of vascular access device: Secondary | ICD-10-CM

## 2016-09-16 DIAGNOSIS — C3491 Malignant neoplasm of unspecified part of right bronchus or lung: Secondary | ICD-10-CM

## 2016-09-16 MED ORDER — MANNITOL 25 % IV SOLN
Freq: Once | INTRAVENOUS | Status: AC
  Administered 2016-09-16: 11:00:00 via INTRAVENOUS
  Filled 2016-09-16: qty 10

## 2016-09-16 MED ORDER — SODIUM CHLORIDE 0.9% FLUSH
10.0000 mL | INTRAVENOUS | Status: DC | PRN
  Administered 2016-09-16: 10 mL
  Filled 2016-09-16: qty 10

## 2016-09-16 MED ORDER — HEPARIN SOD (PORK) LOCK FLUSH 100 UNIT/ML IV SOLN
500.0000 [IU] | Freq: Once | INTRAVENOUS | Status: AC | PRN
  Administered 2016-09-16: 500 [IU]
  Filled 2016-09-16: qty 5

## 2016-09-16 MED ORDER — SODIUM CHLORIDE 0.9 % IV SOLN
120.0000 mg/m2 | Freq: Once | INTRAVENOUS | Status: AC
  Administered 2016-09-16: 200 mg via INTRAVENOUS
  Filled 2016-09-16: qty 10

## 2016-09-16 MED ORDER — PALONOSETRON HCL INJECTION 0.25 MG/5ML
0.2500 mg | Freq: Once | INTRAVENOUS | Status: AC
  Administered 2016-09-16: 0.25 mg via INTRAVENOUS
  Filled 2016-09-16: qty 5

## 2016-09-16 MED ORDER — SODIUM CHLORIDE 0.9 % IV SOLN
Freq: Once | INTRAVENOUS | Status: AC
  Administered 2016-09-16: 13:00:00 via INTRAVENOUS
  Filled 2016-09-16: qty 5

## 2016-09-16 MED ORDER — CISPLATIN CHEMO INJECTION 100MG/100ML
60.0000 mg/m2 | Freq: Once | INTRAVENOUS | Status: AC
  Administered 2016-09-16: 100 mg via INTRAVENOUS
  Filled 2016-09-16: qty 100

## 2016-09-16 MED ORDER — SODIUM CHLORIDE 0.9 % IV SOLN
Freq: Once | INTRAVENOUS | Status: AC
  Administered 2016-09-16: 13:00:00 via INTRAVENOUS

## 2016-09-16 NOTE — Progress Notes (Signed)
Pt had scheduled Nutrition appointment at 945. Had not shown up before 10:15, at which point, appointment was rescheduled for Friday with other Dietitian.   Burtis Junes RD, LDN, CNSC Clinical Nutrition Pager: [phone] 09/16/2016 12:20 PM

## 2016-09-16 NOTE — Patient Instructions (Signed)
[facility] Discharge Instructions for Patients Receiving Chemotherapy   Beginning January 23rd 2017 lab work for the [facility] will be done in the  Main lab at [facility] on 1st floor. If you have a lab appointment with the [facility] please come in thru the  Main Entrance and check in at the main information desk   Today you received the following chemotherapy agents Cisplatin and VP-16. Follow-up as scheduled. Call clinic for any questions or concerns  To help prevent nausea and vomiting after your treatment, we encourage you to take your nausea medication   If you develop nausea and vomiting, or diarrhea that is not controlled by your medication, call the clinic.  The clinic phone number is [phone]. Office hours are Monday-Friday 8:30am-5:00pm.  BELOW ARE SYMPTOMS THAT SHOULD BE REPORTED IMMEDIATELY:  *FEVER GREATER THAN 101.0 F  *CHILLS WITH OR WITHOUT FEVER  NAUSEA AND VOMITING THAT IS NOT CONTROLLED WITH YOUR NAUSEA MEDICATION  *UNUSUAL SHORTNESS OF BREATH  *UNUSUAL BRUISING OR BLEEDING  TENDERNESS IN MOUTH AND THROAT WITH OR WITHOUT PRESENCE OF ULCERS  *URINARY PROBLEMS  *BOWEL PROBLEMS  UNUSUAL RASH Items with * indicate a potential emergency and should be followed up as soon as possible. If you have an emergency after office hours please contact your primary care physician or go to the nearest emergency department.  Please call the clinic during office hours if you have any questions or concerns.   You may also contact the Patient Navigator at [phone] should you have any questions or need assistance in obtaining follow up care.      Resources For Cancer Patients and their Caregivers ? American Cancer Society: Can assist with transportation, wigs, general needs, runs Look Good Feel Better.        ([phone] ? Cancer Care: Provides financial assistance, online support groups, medication/co-pay assistance.   1-800-813-HOPE (414) 289-8283) ? [facility] Assists [place] cancer patients and their families through emotional , educational and financial support.  [phone] ? [place] DSS Where to apply for food stamps, Medicaid and utility assistance. (559) 611-7912 ? RCATS: Transportation to medical appointments. [phone] ? Social Security Administration: May apply for disability if have a Stage IV cancer. [phone] [phone] ? [employer], Disability and Transit Services: Assists with nutrition, care and transit needs. [phone]

## 2016-09-16 NOTE — Progress Notes (Signed)
Holly Knight tolerated chemo tx well without complaints or incident.Port left accessed and flushed easily per protocol for use tomorrow. VSS upon discharge. Pt discharged via wheelchair in satisfactory condition accompanied by her brother

## 2016-09-16 NOTE — Progress Notes (Deleted)
Nutrition Assessment  Reason for Assessment: Referral for wt loss  ASSESSMENT: 56 y/o female PMHx HTN, NSCLC in 2014, and carotid artery stenosis. Newly diagnosed with small cell lung cancer     Dietary Recall:    Nutrition Focused Physical Exam: ***  Medications: ***  Labs: ***  Anthropometrics:  Height: '5\' 7"'$  (08/28/2016) Weight: 124 lbs 10 oz / 56.25 kg (09/15/2016) UBW:   **** BMI: 19.6  Estimated Energy Needs Kcals: 1900-2100 kcals (34-37 kcal/kg bw) Protein: 75-85 g Pro  Fluid: 2 Liters fluid (35 ml/kg bw)  NUTRITION DIAGNOSIS: ***  MALNUTRITION DIAGNOSIS: ***  INTERVENTION: ***      MONITORING, EVALUATION, GOAL: ***   NEXT VISIT: ***    Burtis Junes RD, LDN, CNSC Clinical Nutrition Pager: [phone] 09/16/2016 8:05 AM

## 2016-09-17 ENCOUNTER — Ambulatory Visit (HOSPITAL_COMMUNITY): Payer: Federal, State, Local not specified - PPO

## 2016-09-18 ENCOUNTER — Encounter (HOSPITAL_COMMUNITY): Payer: Self-pay | Admitting: Adult Health

## 2016-09-18 ENCOUNTER — Encounter (HOSPITAL_BASED_OUTPATIENT_CLINIC_OR_DEPARTMENT_OTHER): Payer: Federal, State, Local not specified - PPO

## 2016-09-18 VITALS — BP 127/62 | HR 81 | Temp 98.0°F | Resp 18

## 2016-09-18 DIAGNOSIS — Z452 Encounter for adjustment and management of vascular access device: Secondary | ICD-10-CM

## 2016-09-18 DIAGNOSIS — Z5111 Encounter for antineoplastic chemotherapy: Secondary | ICD-10-CM

## 2016-09-18 DIAGNOSIS — C3432 Malignant neoplasm of lower lobe, left bronchus or lung: Secondary | ICD-10-CM | POA: Diagnosis not present

## 2016-09-18 DIAGNOSIS — C3491 Malignant neoplasm of unspecified part of right bronchus or lung: Secondary | ICD-10-CM

## 2016-09-18 MED ORDER — HEPARIN SOD (PORK) LOCK FLUSH 100 UNIT/ML IV SOLN
INTRAVENOUS | Status: AC
  Filled 2016-09-18: qty 5

## 2016-09-18 MED ORDER — SODIUM CHLORIDE 0.9 % IV SOLN: 10.0000 mg | Freq: Once | INTRAVENOUS | Status: DC

## 2016-09-18 MED ORDER — HEPARIN SOD (PORK) LOCK FLUSH 100 UNIT/ML IV SOLN
500.0000 [IU] | Freq: Once | INTRAVENOUS | Status: AC | PRN
  Administered 2016-09-18: 500 [IU]

## 2016-09-18 MED ORDER — SODIUM CHLORIDE 0.9 % IV SOLN
Freq: Once | INTRAVENOUS | Status: AC
  Administered 2016-09-18: 14:00:00 via INTRAVENOUS

## 2016-09-18 MED ORDER — PEGFILGRASTIM 6 MG/0.6ML ~~LOC~~ PSKT
6.0000 mg | PREFILLED_SYRINGE | Freq: Once | SUBCUTANEOUS | Status: AC
  Administered 2016-09-18: 6 mg via SUBCUTANEOUS

## 2016-09-18 MED ORDER — SODIUM CHLORIDE 0.9 % IV SOLN
120.0000 mg/m2 | Freq: Once | INTRAVENOUS | Status: AC
  Administered 2016-09-18: 200 mg via INTRAVENOUS
  Filled 2016-09-18: qty 10

## 2016-09-18 MED ORDER — DEXAMETHASONE SODIUM PHOSPHATE 10 MG/ML IJ SOLN
10.0000 mg | Freq: Once | INTRAMUSCULAR | Status: AC
  Administered 2016-09-18: 10 mg via INTRAVENOUS

## 2016-09-18 MED ORDER — DEXAMETHASONE SODIUM PHOSPHATE 10 MG/ML IJ SOLN
INTRAMUSCULAR | Status: AC
  Filled 2016-09-18: qty 1

## 2016-09-18 MED ORDER — PEGFILGRASTIM 6 MG/0.6ML ~~LOC~~ PSKT
PREFILLED_SYRINGE | SUBCUTANEOUS | Status: AC
  Filled 2016-09-18: qty 0.6

## 2016-09-18 NOTE — Patient Instructions (Signed)
[facility] Discharge Instructions for Patients Receiving Chemotherapy   Beginning January 23rd 2017 lab work for the [facility] will be done in the  Main lab at [facility] on 1st floor. If you have a lab appointment with the [facility] please come in thru the  Main Entrance and check in at the main information desk   Today you received the following chemotherapy agents Day 3 etoposide.  Neulasta device will dispense medication between 5 pm and 6 pm tomorrow Friday March 23. You may take device off after 6 pm tomorrow. To help prevent nausea and vomiting after your treatment, we encourage you to take your nausea medication as instructed.  Return as scheduled.   If you develop nausea and vomiting, or diarrhea that is not controlled by your medication, call the clinic.  The clinic phone number is [phone]. Office hours are Monday-Friday 8:30am-5:00pm.  BELOW ARE SYMPTOMS THAT SHOULD BE REPORTED IMMEDIATELY:  *FEVER GREATER THAN 101.0 F  *CHILLS WITH OR WITHOUT FEVER  NAUSEA AND VOMITING THAT IS NOT CONTROLLED WITH YOUR NAUSEA MEDICATION  *UNUSUAL SHORTNESS OF BREATH  *UNUSUAL BRUISING OR BLEEDING  TENDERNESS IN MOUTH AND THROAT WITH OR WITHOUT PRESENCE OF ULCERS  *URINARY PROBLEMS  *BOWEL PROBLEMS  UNUSUAL RASH Items with * indicate a potential emergency and should be followed up as soon as possible. If you have an emergency after office hours please contact your primary care physician or go to the nearest emergency department.  Please call the clinic during office hours if you have any questions or concerns.   You may also contact the Patient Navigator at [phone] should you have any questions or need assistance in obtaining follow up care.      Resources For Cancer Patients and their Caregivers ? American Cancer Society: Can assist with transportation, wigs, general needs, runs Look Good Feel Better.        (303)621-6531 ? Cancer  Care: Provides financial assistance, online support groups, medication/co-pay assistance.  1-800-813-HOPE 779-501-8685) ? [facility] Assists [place] cancer patients and their families through emotional , educational and financial support.  236-888-0915 ? [place] DSS Where to apply for food stamps, Medicaid and utility assistance. 219-511-5774 ? RCATS: Transportation to medical appointments. 865-086-3342 ? Social Security Administration: May apply for disability if have a Stage IV cancer. [phone] ? [employer], Disability and Transit Services: Assists with nutrition, care and transit needs. [phone]

## 2016-09-18 NOTE — Progress Notes (Signed)
Pt taken to the journey room to pick out a wig. 

## 2016-09-18 NOTE — Progress Notes (Signed)
Tolerated chemo well. [place] KitchenLydia Knight arrived today for ONPRO neulasta on body injector. See MAR for administration details. Injector in place and engaged with green light indicator on flashing. Tolerated application with out problems. Stable on discharge home with family via wheelchair.

## 2016-09-18 NOTE — Progress Notes (Signed)
Patient here today for treatment and requesting prescription for Ensure. States she can receive the nutritional supplement from the [employer] office in [place], but she must have a prescription from a provider.    Patient scheduled for nutritional consult appointment tomorrow. Ms. Picker states that she will not be able to return to [facility] tomorrow for this appointment.    I spoke with Burtis Junes, RD via phone. He recommends she drink 3-4 Ensure cans/day. Paper prescription provided for patient to take to [facility].  We will reschedule her nutrition appt for the Tuesday she is back in [place] for her next cycle of chemotherapy. Patient agreed with this plan. Amy made aware to help reschedule nutrition appt.    Mike Craze, NP [place] [phone]

## 2016-09-19 ENCOUNTER — Encounter (HOSPITAL_COMMUNITY): Payer: Federal, State, Local not specified - PPO

## 2016-10-06 ENCOUNTER — Ambulatory Visit (HOSPITAL_COMMUNITY): Payer: Federal, State, Local not specified - PPO

## 2016-10-07 ENCOUNTER — Encounter (HOSPITAL_COMMUNITY): Payer: Federal, State, Local not specified - PPO | Admitting: Dietician

## 2016-10-07 ENCOUNTER — Ambulatory Visit (HOSPITAL_COMMUNITY): Payer: Federal, State, Local not specified - PPO

## 2016-10-08 ENCOUNTER — Ambulatory Visit (HOSPITAL_COMMUNITY): Payer: Federal, State, Local not specified - PPO

## 2017-09-20 IMAGING — CR DG CHEST 1V PORT
1 series · 1 of 1 positions shown · non-contrast
Comparison: Radiographs April 13, 2013.

CLINICAL DATA: Status post Port-A-Cath placement.

EXAM:
PORTABLE CHEST 1 VIEW

[ap portable]
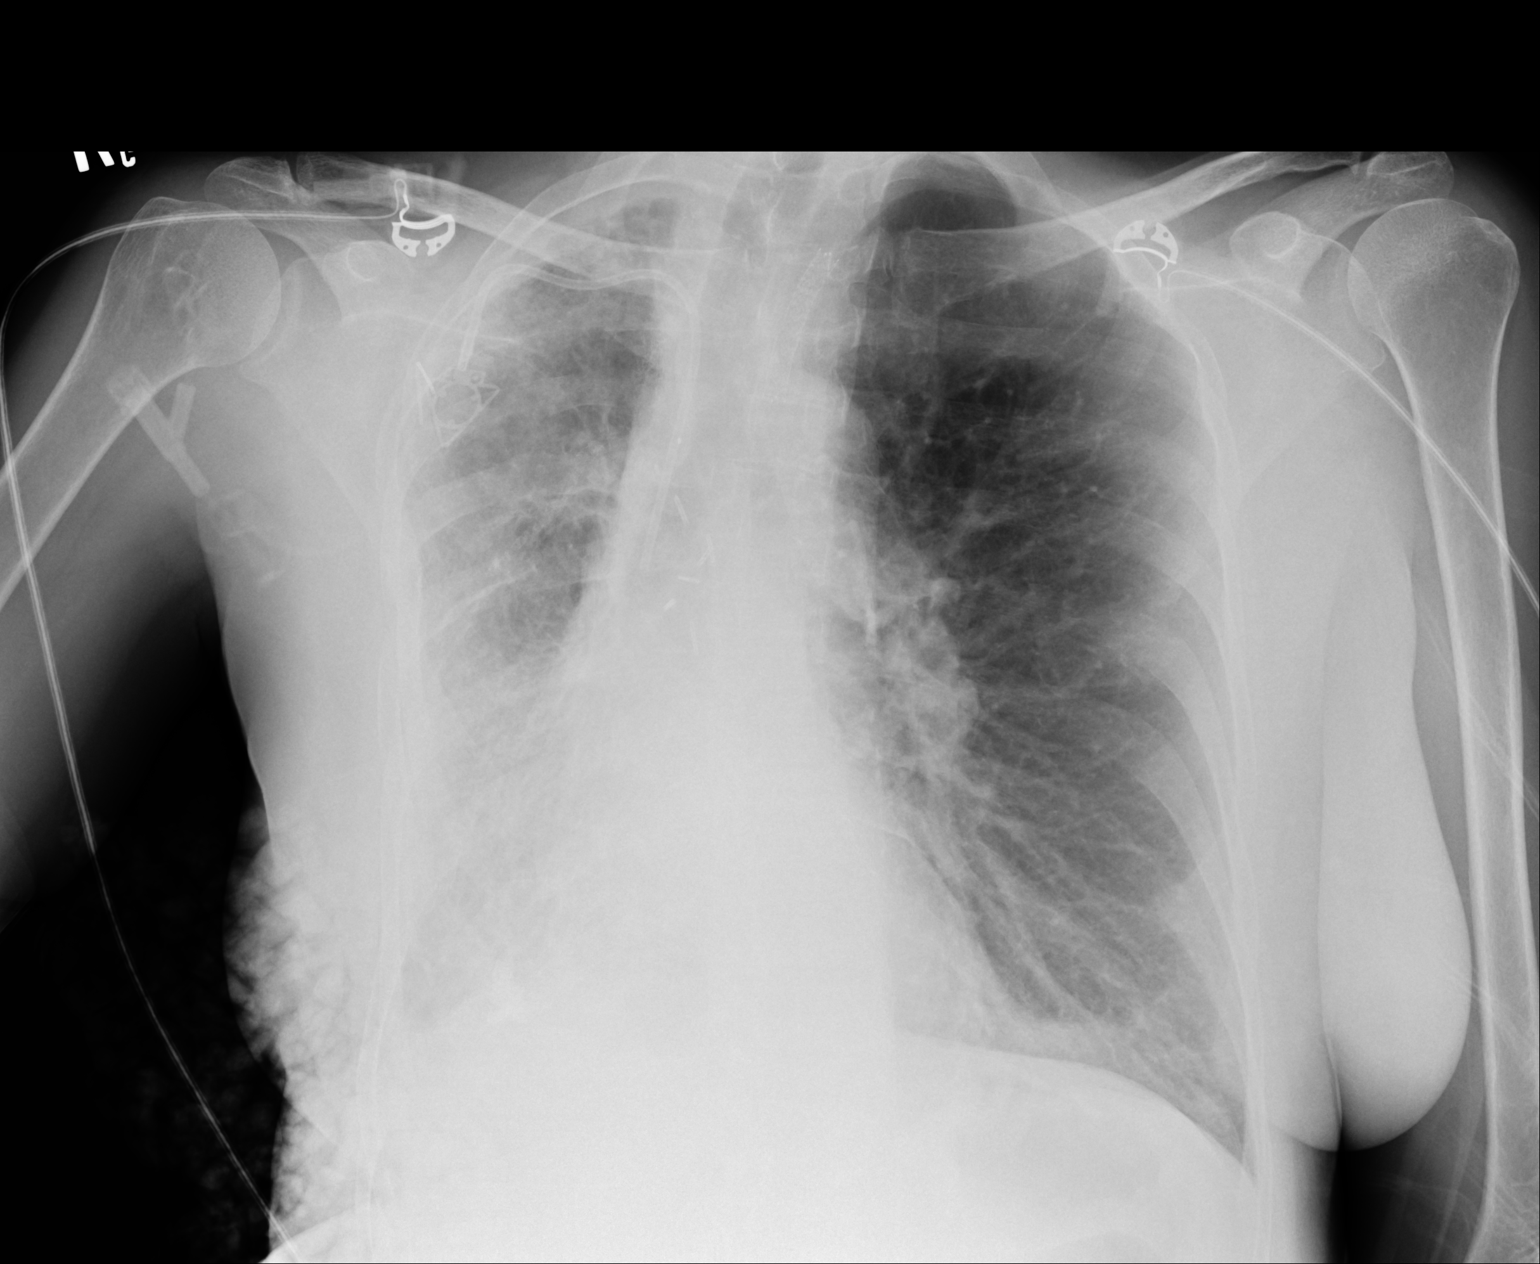

[1 of 1 positions shown; findings below may reference images not displayed]

FINDINGS: Stable cardiomediastinal silhouette. No pneumothorax is noted.
Interval placement of right subclavian Port-A-Cath with distal tip
in expected position of SVC. Postoperative changes and volume loss
are seen involving the right hemithorax. Left lung is hyperexpanded
but otherwise normal. Bony thorax is unremarkable.
IMPRESSION: Stable postoperative changes and volume loss seen in right
hemithorax. Interval placement of right subclavian Port-A-Cath with
distal tip in expected position of SVC. No pneumothorax is noted.

## 2017-10-28 DEATH — deceased
